# Patient Record
Sex: Female | Born: 1955
Health system: Southern US, Community
[De-identification: ages and names within clinical notes are randomized; demographics above are authoritative.]

## PROBLEM LIST (undated history)

## (undated) DIAGNOSIS — A64 Unspecified sexually transmitted disease: Secondary | ICD-10-CM

## (undated) DIAGNOSIS — E785 Hyperlipidemia, unspecified: Secondary | ICD-10-CM

## (undated) DIAGNOSIS — IMO0002 Reserved for concepts with insufficient information to code with codable children: Secondary | ICD-10-CM

## (undated) DIAGNOSIS — F17200 Nicotine dependence, unspecified, uncomplicated: Secondary | ICD-10-CM

## (undated) DIAGNOSIS — C44311 Basal cell carcinoma of skin of nose: Secondary | ICD-10-CM

## (undated) DIAGNOSIS — M1990 Unspecified osteoarthritis, unspecified site: Secondary | ICD-10-CM

## (undated) DIAGNOSIS — F329 Major depressive disorder, single episode, unspecified: Secondary | ICD-10-CM

## (undated) DIAGNOSIS — F419 Anxiety disorder, unspecified: Secondary | ICD-10-CM

## (undated) DIAGNOSIS — N951 Menopausal and female climacteric states: Secondary | ICD-10-CM

## (undated) DIAGNOSIS — I700 Atherosclerosis of aorta: Secondary | ICD-10-CM

## (undated) DIAGNOSIS — K219 Gastro-esophageal reflux disease without esophagitis: Secondary | ICD-10-CM

## (undated) DIAGNOSIS — B009 Herpesviral infection, unspecified: Secondary | ICD-10-CM

## (undated) DIAGNOSIS — IMO0001 Reserved for inherently not codable concepts without codable children: Secondary | ICD-10-CM

## (undated) DIAGNOSIS — F32A Depression, unspecified: Secondary | ICD-10-CM

## (undated) DIAGNOSIS — J449 Chronic obstructive pulmonary disease, unspecified: Secondary | ICD-10-CM

## (undated) DIAGNOSIS — G44209 Tension-type headache, unspecified, not intractable: Secondary | ICD-10-CM

## (undated) DIAGNOSIS — J189 Pneumonia, unspecified organism: Secondary | ICD-10-CM

## (undated) DIAGNOSIS — C50411 Malignant neoplasm of upper-outer quadrant of right female breast: Secondary | ICD-10-CM

## (undated) HISTORY — PX: DILATION AND CURETTAGE OF UTERUS: SHX78

## (undated) HISTORY — DX: Nicotine dependence, unspecified, uncomplicated: F17.200

## (undated) HISTORY — PX: WISDOM TOOTH EXTRACTION: SHX21

## (undated) HISTORY — DX: Menopausal and female climacteric states: N95.1

## (undated) HISTORY — DX: Malignant neoplasm of upper-outer quadrant of right female breast: C50.411

## (undated) HISTORY — PX: BASAL CELL CARCINOMA EXCISION: SHX1214

## (undated) HISTORY — DX: Reserved for inherently not codable concepts without codable children: IMO0001

## (undated) HISTORY — DX: Major depressive disorder, single episode, unspecified: F32.9

## (undated) HISTORY — DX: Unspecified sexually transmitted disease: A64

## (undated) HISTORY — DX: Depression, unspecified: F32.A

## (undated) HISTORY — DX: Chronic obstructive pulmonary disease, unspecified: J44.9

## (undated) HISTORY — DX: Unspecified osteoarthritis, unspecified site: M19.90

## (undated) HISTORY — DX: Herpesviral infection, unspecified: B00.9

## (undated) HISTORY — PX: BACK SURGERY: SHX140

## (undated) HISTORY — DX: Reserved for concepts with insufficient information to code with codable children: IMO0002

## (undated) HISTORY — DX: Atherosclerosis of aorta: I70.0

---

## 1984-07-18 HISTORY — PX: ABDOMINAL EXPLORATION SURGERY: SHX538

## 1998-09-15 ENCOUNTER — Other Ambulatory Visit: Admission: RE | Admit: 1998-09-15 | Discharge: 1998-09-15 | Payer: Self-pay | Admitting: Obstetrics and Gynecology

## 1998-10-02 ENCOUNTER — Encounter: Payer: Self-pay | Admitting: Obstetrics and Gynecology

## 1998-10-02 ENCOUNTER — Ambulatory Visit (HOSPITAL_COMMUNITY): Admission: RE | Admit: 1998-10-02 | Discharge: 1998-10-02 | Payer: Self-pay | Admitting: Obstetrics and Gynecology

## 1998-12-04 ENCOUNTER — Encounter: Payer: Self-pay | Admitting: Family Medicine

## 1998-12-04 ENCOUNTER — Ambulatory Visit (HOSPITAL_COMMUNITY): Admission: RE | Admit: 1998-12-04 | Discharge: 1998-12-04 | Payer: Self-pay | Admitting: Family Medicine

## 1999-12-21 ENCOUNTER — Other Ambulatory Visit: Admission: RE | Admit: 1999-12-21 | Discharge: 1999-12-21 | Payer: Self-pay | Admitting: Obstetrics and Gynecology

## 2002-04-24 ENCOUNTER — Other Ambulatory Visit: Admission: RE | Admit: 2002-04-24 | Discharge: 2002-04-24 | Payer: Self-pay | Admitting: Obstetrics and Gynecology

## 2002-05-23 HISTORY — PX: ENDOMETRIAL BIOPSY: SHX622

## 2002-09-03 ENCOUNTER — Encounter (INDEPENDENT_AMBULATORY_CARE_PROVIDER_SITE_OTHER): Payer: Self-pay | Admitting: Specialist

## 2002-09-03 ENCOUNTER — Ambulatory Visit (HOSPITAL_COMMUNITY): Admission: RE | Admit: 2002-09-03 | Discharge: 2002-09-03 | Payer: Self-pay | Admitting: Obstetrics and Gynecology

## 2002-09-03 ENCOUNTER — Encounter (INDEPENDENT_AMBULATORY_CARE_PROVIDER_SITE_OTHER): Payer: Self-pay

## 2002-09-03 HISTORY — PX: EXCISION OF ADNEXAL MASS: SHX5820

## 2002-09-14 DIAGNOSIS — N951 Menopausal and female climacteric states: Secondary | ICD-10-CM

## 2002-09-14 HISTORY — DX: Menopausal and female climacteric states: N95.1

## 2003-05-09 ENCOUNTER — Other Ambulatory Visit: Admission: RE | Admit: 2003-05-09 | Discharge: 2003-05-09 | Payer: Self-pay | Admitting: Obstetrics and Gynecology

## 2003-12-28 ENCOUNTER — Emergency Department (HOSPITAL_COMMUNITY): Admission: EM | Admit: 2003-12-28 | Discharge: 2003-12-28 | Payer: Self-pay | Admitting: *Deleted

## 2004-01-01 ENCOUNTER — Ambulatory Visit (HOSPITAL_COMMUNITY): Admission: RE | Admit: 2004-01-01 | Discharge: 2004-01-01 | Payer: Self-pay | Admitting: Family Medicine

## 2004-01-15 ENCOUNTER — Ambulatory Visit (HOSPITAL_COMMUNITY): Admission: RE | Admit: 2004-01-15 | Discharge: 2004-01-16 | Payer: Self-pay | Admitting: Neurological Surgery

## 2004-01-16 HISTORY — PX: ANTERIOR CERVICAL DECOMP/DISCECTOMY FUSION: SHX1161

## 2004-03-01 ENCOUNTER — Encounter: Admission: RE | Admit: 2004-03-01 | Discharge: 2004-03-01 | Payer: Self-pay | Admitting: Neurological Surgery

## 2004-04-12 ENCOUNTER — Encounter: Admission: RE | Admit: 2004-04-12 | Discharge: 2004-04-12 | Payer: Self-pay | Admitting: Neurological Surgery

## 2004-07-05 ENCOUNTER — Encounter: Admission: RE | Admit: 2004-07-05 | Discharge: 2004-07-05 | Payer: Self-pay | Admitting: Neurological Surgery

## 2004-07-09 ENCOUNTER — Other Ambulatory Visit: Admission: RE | Admit: 2004-07-09 | Discharge: 2004-07-09 | Payer: Self-pay | Admitting: Obstetrics and Gynecology

## 2005-01-21 ENCOUNTER — Ambulatory Visit (HOSPITAL_COMMUNITY): Admission: RE | Admit: 2005-01-21 | Discharge: 2005-01-21 | Payer: Self-pay | Admitting: Family Medicine

## 2005-08-25 ENCOUNTER — Other Ambulatory Visit: Admission: RE | Admit: 2005-08-25 | Discharge: 2005-08-25 | Payer: Self-pay | Admitting: Obstetrics and Gynecology

## 2006-09-29 ENCOUNTER — Other Ambulatory Visit: Admission: RE | Admit: 2006-09-29 | Discharge: 2006-09-29 | Payer: Self-pay | Admitting: Obstetrics & Gynecology

## 2007-10-02 ENCOUNTER — Other Ambulatory Visit: Admission: RE | Admit: 2007-10-02 | Discharge: 2007-10-02 | Payer: Self-pay | Admitting: Obstetrics and Gynecology

## 2008-10-03 ENCOUNTER — Encounter: Admission: RE | Admit: 2008-10-03 | Discharge: 2008-10-03 | Payer: Self-pay | Admitting: Family Medicine

## 2008-10-07 ENCOUNTER — Other Ambulatory Visit: Admission: RE | Admit: 2008-10-07 | Discharge: 2008-10-07 | Payer: Self-pay | Admitting: Obstetrics and Gynecology

## 2009-06-23 HISTORY — PX: ENDOMETRIAL BIOPSY: SHX622

## 2010-10-06 HISTORY — PX: COLONOSCOPY W/ BIOPSIES: SHX1374

## 2010-12-03 NOTE — Op Note (Signed)
NAME:  Chelsea Patterson, Chelsea Patterson NO.:  [id number]   MEDICAL RECORD NO.:  [id number]                   PATIENT TYPE:  AMB   LOCATION:  SDC                                  FACILITY:  [facility]   PHYSICIAN:  Laqueta Linden, M.D.                 DATE OF BIRTH:  [birth year]   DATE OF PROCEDURE:  09/03/2002  DATE OF DISCHARGE:                                 OPERATIVE REPORT   PREOPERATIVE DIAGNOSIS:  Right adnexal mass, rule out dermoid.   POSTOPERATIVE DIAGNOSES:  1. Right adnexal mass, fibroid versus fibroma.  2. Left ovarian endometrioma per frozen section, Dr. Debby Bud.  3. Pelvic adhesions.   PROCEDURE:  Diagnostic laparoscopy with left salpingo-oophorectomy, excision  of right ovarian mass, lysis of adhesions.   SURGEON:  Laqueta Linden, M.D.   ASSISTANT:  Edwena Felty. Romine, M.D.   ANESTHESIA:  General endotracheal anesthesia.   ESTIMATED BLOOD LOSS:  Less than 20 cc.   COUNTS:  Correct.   COMPLICATIONS:  None.   INDICATIONS FOR PROCEDURE:  The patient is a 55 year old gravida 6, para 0-1-  5-1 white female with an incidental finding of a right adnexal mass  consistent with a dermoid on ultrasound done for dysfunctional uterine  bleeding.  She has been on progesterone only pills for the past three months  with a partial response in terms of her bleeding.  She was desiring of the  most conservative surgery possible to remove the abnormality noted on  ultrasound.  CA125 was normal.  Her past history is notable for prior  cesarean section, prior ectopic pregnancy, surgery of unknown type and prior  spontaneous pregnancy losses x4..  The patient has been counseled as to the  risks, benefits, alternatives, and complications of the procedure and  desires conservative surgery even if mini-laparotomy is required and even if  it is felt that subsequent surgery will be necessary to definitively solve  her problem.  She does not desire hysterectomy at this time.  Full  consent  has been given after discussing risks, benefits, alternatives and  complications, recovery expectations.   DESCRIPTION OF PROCEDURE:  The patient was taken to the operating room and  placed on the operating table in the supine position.  After induction of  general endotracheal anesthesia, she is placed in the dorsal lithotomy  position and the abdomen, perineum and vagina were prepped and draped in  routine sterile fashion.  A transurethral Foley was placed.  A Hulka  tenaculum was placed in the anterior lip of the anterior mobile uterus.  Attention was then turned abdominally.  A 2 cm infraumbilical incision was  made.  The Veress needle was inserted in the peritoneal cavity with  intraperitoneal placement confirmed by hanging drop and saline installation  tests.  Several liters of CO2 were infused.  The Veress needle was then  removed and the trocar was then inserted without difficulty.  Inspection at  the insertion site revealed a slight amount of dripping of blood from where  the trocar entered the peritoneal cavity.  There was no obvious injury or  trauma to the omentum or surrounding bowel and the blood appeared to be  coming from surrounding where the trocar was inserted.  A 5 mm trocar was  then placed under direct vision in the right mid quadrant and a 10 mm port  was placed in the left mid quadrant after transilluminating vessels.  Inspection of the upper abdomen revealed filmy anterior adhesions in the  right mid quadrant.  The liver appeared normal.  The patient was placed in  steep Trendelenburg position. There were noted to be filmy bowel adhesions  in both adnexal regions.  The appendix appeared normal and was coiled and  was somewhat adherent just beneath the right ovary.  The right tube was  completely normal in appearance.  The right ovary was freely mobile. There  was an approximately 1 cm clear, follicular appearing cyst on one pole of  the ovary.  The other  pole of the ovary had an approximately 1.5 cm solid  appearing area felt most consistent with a fibroma.  Attention was then  turned to the left tube and ovary.  The left tube appeared normal.  The left  ovary was notable for a protuberant hemorrhagic appearing mass which also  had some yellowish fatty type tissue surrounding it and was felt to be  consistent with possibly an endometrioma or possibly the dermoid that was  felt to have been seen on ultrasound and that perhaps the ultrasound had  identified the opposite ovary if both ovaries were in the cul-de-sac.  In  any event, it was felt that the left ovarian pathology was more substantial  and required excision.  It was felt that the right ovarian mass could easily  be excised with retention of that ovary.  At this point, the tripolar  cautery was then used to cauterize and transect the left utero-ovarian  ligament and fallopian tube at the uterine cornua.  The bowel adhesions were  freed up and mobilized such that the ovary was freely mobile.  The 3-0  Vicryl Endoloops were then placed around the infundibulopelvic ligament and  the specimen was excised.  It was placed in an endosac and removed without  rupture.  Hemostasis was noted to be excellent.  This was sent to pathology  for frozen section.  Per Dr. Debby Bud, it was felt to be consistent with a  benign endometrioma.  Attention was then turned to the right ovary.  As the  patient definitely did not wish both adnexa to be removed, and due to the  location and size and characteristics of the mass, it was felt best  approached by excision of the mass only.  This was easily accomplished using  the monopolar cautery scissors and this 1.5 cm solid type mass was easily  shelled out of the ovary.  This was also placed in an endosac and removed  without incident.  The ovary where this was located was cauterized. Hemostasis was excellent.  The remainder of the ovarian tissue appeared  viable  and within normal limits.  The follicular appearing cyst was left  untouched.  Copious lavage was then accomplished.  Operative sites were  noted to be hemostatic.  All trocars were removed after visualizing the  insertional trocar from below using a 5 mm scope.  This was placed in the  left lower quadrant site with visualization around the umbilical trocar.  There were some hemorrhagic areas in the peritoneum but no evidence of  hematoma and no active bleeding.  At this point, the trocars were removed  under direct vision.  The fascia in the 10 mm port was closed with 0 Vicryl  suture.  The pneumoperitoneum was allowed to escape and the umbilical port  was then removed.  The incisions were then closed with subcuticular sutures  of 4-0 Vicryl.  Steri-Strips and pressure dressings were applied.  The  incisions were injected with a total of 10 cc of 0.25% plain Marcaine.  Pressure dressings were applied.  The Hulka tenaculum and Foley catheter  were removed.  The patient was stable and extubated on transfer to the  recovery room.  She will be observed and discharged per anesthesia protocol.  She was given routine written and verbal discharge instructions and told to  follow up in the office in two to three weeks' time or sooner if she has any  problems.  She was given a prescription for Percocet 5/325 dispense 15 one  to two q.4-6h.  p.r.n. pain with no refills and told to take Advil and Aleve and continue  her Progesterone only pills and all her routine medications postoperatively.  Of note, she received Toradol 30 mg IV and 30 mg IM prior to the conclusion  of the procedure.                                               Laqueta Linden, M.D.    LKS/MEDQ  D:  09/03/2002  T:  09/03/2002  Job:  [id number]   cc:   Holley Bouche, M.D.  [address]  [place], [place] [address]  Fax: [phone]

## 2010-12-03 NOTE — Op Note (Signed)
NAME:  Chelsea Patterson, Chelsea Patterson NO.:  [id number]   MEDICAL RECORD NO.:  [id number]                   PATIENT TYPE:  OIB   LOCATION:  3003                                 FACILITY:  [facility]   PHYSICIAN:  Tia Alert, MD                  DATE OF BIRTH:  29-Jul-1955   DATE OF PROCEDURE:  DATE OF DISCHARGE:                                 OPERATIVE REPORT   DATE OF OPERATION:  January 15, 2004.   PREOPERATIVE DIAGNOSIS:  Cervical disk herniation, C5-6, with left C6  radiculopathy.   POSTOPERATIVE DIAGNOSIS:  Cervical disk herniation, C5-6, with left C6  radiculopathy.   PROCEDURE:  1. Decompressive anterior cervical diskectomy, C5-6, for central canal and     nerve root decompression.  2. Anterior cervical arthrodesis, C5-6, utilizing a 6 mm Tutogen allograft.  3. Anterior cervical plating, C5-6, utilizing a 22.5 mm Zephyr plate.   SURGEON:  Tia Alert, MD.   ASSISTANT:  Donalee Citrin, MD.   ANESTHESIA:  General endotracheal.   COMPLICATIONS:  None apparent.   INDICATIONS FOR PROCEDURE:  Chelsea Patterson is a 55 year old white female, who  was referred to the [facility] with the complaints of neck pain  with left arm pain which failed to respond to medical management.  She had  severe left arm pain with some numbness and tingling.  She had an MRI which  showed a large cervical disk herniation at C5-6 on the left and I  recommended anterior cervical diskectomy and fusion with plating of C5-6.  She understood the risks and benefits and alternatives and wished to  proceed.   DESCRIPTION OF PROCEDURE:  The patient was taken to the operating room.  After induction of adequate general endotracheal anesthesia, she was placed  on a supine position on the operating room table where her right anterior  cervical region was prepped with Duraprep and then draped in the usual  sterile fashion.  5 mL of local anesthesia were injected and an incision was  made  through the right of midline and carried down to the platysmal muscle  which was opened and undermined with Metzenbaum scissors.  I then dissected  in a plane medial to the sternocleidomastoid muscle and internal carotid  artery and lateral to the trachea and esophagus to expose the anterior  cervical spine at C5-6.  Intraoperative fluoroscopy confirmed that level and  then the disk space was incised, and initial diskectomy was done with a  pituitary rongeur.  We then took down the longus coli muscle bilaterally and  placed the Shadowline retractors under these.  We then used curettes to  complete most of the diskectomy along with pituitary rongeurs.  We then  drilled the end-plates with the high-speed air powered Black Max drill and  drilled down to the level of the posterior longitudinal ligament.  The  operative microscope was brought onto the field and then  the posterior  longitudinal ligament was opened and removed in a circumferential fashion  with the Kerrison punch.  A large disk herniation was found at C5-6 on the  left side in the foramen and this removed with a nerve hook and pituitary  rongeurs.  We then performed a wide foraminotomy at C5-6 on the left  following the nerve root out into the foramen.  Once the decompression was  complete, we irrigated with saline solution, dried the bed with Gelfoam,  measured the interspace to be 6 mm, and then tapped a 6 mm Tutogen allograft  into the interspace at C5-6.  We then used a 22.5 mm Zephyr plate and placed  two 13 mm screws in the body of C5 and two in the body of C6, and then  locked these into position with a sliding mechanism.  We then removed the  retractor, checked our plate and graft placement with fluoroscopy.  We then  dried all bleeding points with bipolar cautery.  Once meticulous hemostasis  was achieved, we closed the deep platysma and then the subcuticular tissue  with 3-0 Vicryl, and then closed the skin with  Dermabond.  The drapes were  removed.  The patient was awakened from general anesthesia and transferred  to the recovery room in stable condition.  At the end of the procedure, all  sponge, needle, and instrument counts were correct.                                               Tia Alert, MD    DSJ/MEDQ  D:  01/15/2004  T:  01/15/2004  Job:  [id number]

## 2012-09-19 ENCOUNTER — Encounter: Payer: Self-pay | Admitting: Nurse Practitioner

## 2012-09-19 DIAGNOSIS — F339 Major depressive disorder, recurrent, unspecified: Secondary | ICD-10-CM | POA: Insufficient documentation

## 2012-10-23 ENCOUNTER — Ambulatory Visit: Payer: Self-pay | Admitting: Nurse Practitioner

## 2012-10-23 ENCOUNTER — Encounter: Payer: Self-pay | Admitting: Nurse Practitioner

## 2012-10-23 DIAGNOSIS — Z01419 Encounter for gynecological examination (general) (routine) without abnormal findings: Secondary | ICD-10-CM

## 2012-11-23 ENCOUNTER — Other Ambulatory Visit: Payer: Self-pay | Admitting: Nurse Practitioner

## 2012-11-23 NOTE — Telephone Encounter (Signed)
pts last aex was on 10/21/2011. Next aex on 11/29/2012. Refilled Estrace 1mg  and Provera 10mg - both #30/0 refills to maintain until next appt.

## 2012-11-23 NOTE — Telephone Encounter (Signed)
pts last aex was on 10/21/2011. Next aex on 11/29/2012. Refilled Estrace 1mg  and Provera 10mg - both 1 po qd #30/0 refills to maintain until next appt.

## 2012-11-29 ENCOUNTER — Ambulatory Visit: Payer: Self-pay | Admitting: Nurse Practitioner

## 2012-12-27 ENCOUNTER — Encounter: Payer: Self-pay | Admitting: Nurse Practitioner

## 2012-12-27 ENCOUNTER — Ambulatory Visit (INDEPENDENT_AMBULATORY_CARE_PROVIDER_SITE_OTHER): Payer: 59 | Admitting: Nurse Practitioner

## 2012-12-27 VITALS — BP 124/70 | HR 74 | Resp 14 | Ht 67.0 in | Wt 142.2 lb

## 2012-12-27 DIAGNOSIS — Z0000 Encounter for general adult medical examination without abnormal findings: Secondary | ICD-10-CM

## 2012-12-27 DIAGNOSIS — Z01419 Encounter for gynecological examination (general) (routine) without abnormal findings: Secondary | ICD-10-CM

## 2012-12-27 LAB — POCT URINALYSIS DIPSTICK
Leukocytes, UA: NEGATIVE
Urobilinogen, UA: NEGATIVE

## 2012-12-27 MED ORDER — MEDROXYPROGESTERONE ACETATE 10 MG PO TABS: 10.0000 mg | ORAL_TABLET | Freq: Every day | ORAL | Status: DC

## 2012-12-27 MED ORDER — ESTRADIOL 1 MG PO TABS: ORAL_TABLET | ORAL | Status: DC
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
## 2012-12-27 NOTE — Progress Notes (Signed)
If the citalopram and fatigue seem related, may be a drug side effect.  Common with citalopram.  She may need to change from the citalopram.

## 2012-12-27 NOTE — Patient Instructions (Addendum)

## 2013-01-01 ENCOUNTER — Telehealth: Payer: Self-pay | Admitting: Nurse Practitioner

## 2013-01-01 NOTE — Telephone Encounter (Signed)
I called and left message on cell phone that Dr. Hyacinth Meeker reviewed her concerns about fatigue and felt that is was most likely related to Citalopram and she needed to discuss with PCP.  I believe patient had appointment upcoming end on June or first of July. If any questions to call back.

## 2013-02-02 ENCOUNTER — Other Ambulatory Visit: Payer: Self-pay | Admitting: Nurse Practitioner

## 2013-02-04 ENCOUNTER — Telehealth: Payer: Self-pay

## 2013-02-04 NOTE — Telephone Encounter (Signed)
lmtcb needs to have MMG before can refill HRT

## 2013-02-05 NOTE — Telephone Encounter (Signed)
Patient is calling you back .? She is not sure why you call . Patient needs refills on Medicine. You can call her back [phone]  Ext. [phone] leave message. Per Patient please let her know what this is concerning.

## 2013-02-11 NOTE — Telephone Encounter (Signed)
7/28 spoke with patient re: refill of HRT request. Patient states was unaware she needed to have mammogram before she could get refills. Per note at [facility] in 2014 was told she must get this done, last one in 2012. I gave her the number to [facility] and told her we might be able to give her one month, but that would be all and she MUST get mmg done. She has been off this x one week and would really appreciate one month. Please advise if that is ok.

## 2013-02-12 MED ORDER — ESTRADIOL 1 MG PO TABS: ORAL_TABLET | ORAL | Status: DC

## 2013-02-12 MED ORDER — MEDROXYPROGESTERONE ACETATE 10 MG PO TABS: 10.0000 mg | ORAL_TABLET | Freq: Every day | ORAL | Status: DC

## 2013-02-12 NOTE — Telephone Encounter (Signed)
One month Rx sent to pharmacy. Patient is aware to schedule mmg or no more refills.

## 2013-02-12 NOTE — Telephone Encounter (Signed)
OK to give # 30 tablets only until mammo is done.  No more.

## 2013-03-17 ENCOUNTER — Other Ambulatory Visit: Payer: Self-pay | Admitting: Nurse Practitioner

## 2013-03-19 ENCOUNTER — Ambulatory Visit
Admission: RE | Admit: 2013-03-19 | Discharge: 2013-03-19 | Disposition: A | Discharge status: Home / Self Care | Payer: 59 | Source: Ambulatory Visit | Attending: Family Medicine | Admitting: Family Medicine

## 2013-03-19 ENCOUNTER — Other Ambulatory Visit: Payer: Self-pay | Admitting: Family Medicine

## 2013-03-19 DIAGNOSIS — J449 Chronic obstructive pulmonary disease, unspecified: Secondary | ICD-10-CM

## 2013-03-19 NOTE — Telephone Encounter (Signed)
Per note patient is not allowed to have refill on HRT unless MMG is scheduled. No MMG has been scheduled yet, please advise.

## 2013-03-20 ENCOUNTER — Telehealth: Payer: Self-pay | Admitting: Nurse Practitioner

## 2013-03-20 NOTE — Telephone Encounter (Signed)
Patient needs to have her Estradiol 1 mg, medroxyprogesterone 10 mg. She has been without these for 2 days now. Pharmacist said they were told that she had to make an appointment to get these refilled.

## 2013-03-20 NOTE — Telephone Encounter (Signed)
VM confirms "Chelsea Patterson" and left message that we needed MMG result before RX could we refill medication.  Will call [facility] in am (now 5:05 pm) for result and call her back.  Last refilled 02-12-13 for 30 days pending MMG result.

## 2013-03-21 NOTE — Telephone Encounter (Signed)
Estrodial and medroxyprogestrone  refill request from patient, patient has MMG  02/14/2013 [facility] [phone]

## 2013-03-22 ENCOUNTER — Other Ambulatory Visit: Payer: Self-pay

## 2013-03-22 MED ORDER — ESTRADIOL 1 MG PO TABS: ORAL_TABLET | ORAL | Status: DC

## 2013-03-22 MED ORDER — MEDROXYPROGESTERONE ACETATE 10 MG PO TABS: 10.0000 mg | ORAL_TABLET | Freq: Every day | ORAL | Status: DC

## 2013-03-22 NOTE — Telephone Encounter (Signed)
rxs sent for 1 mth with 10 refills or if insurance allows supply with 3 refills

## 2013-03-22 NOTE — Telephone Encounter (Signed)
OK for a refill on both meds

## 2013-03-22 NOTE — Telephone Encounter (Signed)
Left a voice message telling patient that we rec'd her most recent mammogram report dated 02/14/13 I have forwarded this to [facility].

## 2013-03-22 NOTE — Telephone Encounter (Signed)
Waiting for [facility] to fax recent mammo report.

## 2013-11-25 ENCOUNTER — Encounter: Payer: Self-pay | Admitting: Nurse Practitioner

## 2014-01-02 ENCOUNTER — Ambulatory Visit: Payer: 59 | Admitting: Nurse Practitioner

## 2014-01-07 ENCOUNTER — Encounter: Payer: Self-pay | Admitting: Nurse Practitioner

## 2014-01-07 ENCOUNTER — Ambulatory Visit (INDEPENDENT_AMBULATORY_CARE_PROVIDER_SITE_OTHER): Payer: 59 | Admitting: Nurse Practitioner

## 2014-01-07 VITALS — BP 100/64 | HR 60 | Ht 67.0 in | Wt 144.0 lb

## 2014-01-07 DIAGNOSIS — Z01419 Encounter for gynecological examination (general) (routine) without abnormal findings: Secondary | ICD-10-CM

## 2014-01-07 DIAGNOSIS — B373 Candidiasis of vulva and vagina: Secondary | ICD-10-CM

## 2014-01-07 DIAGNOSIS — F329 Major depressive disorder, single episode, unspecified: Secondary | ICD-10-CM

## 2014-01-07 DIAGNOSIS — B3731 Acute candidiasis of vulva and vagina: Secondary | ICD-10-CM

## 2014-01-07 DIAGNOSIS — F3289 Other specified depressive episodes: Secondary | ICD-10-CM

## 2014-01-07 DIAGNOSIS — F32A Depression, unspecified: Secondary | ICD-10-CM

## 2014-01-07 DIAGNOSIS — J449 Chronic obstructive pulmonary disease, unspecified: Secondary | ICD-10-CM

## 2014-01-07 DIAGNOSIS — Z0000 Encounter for general adult medical examination without abnormal findings: Secondary | ICD-10-CM

## 2014-01-07 LAB — POCT URINALYSIS DIPSTICK
Bilirubin, UA: NEGATIVE
Blood, UA: NEGATIVE
GLUCOSE UA: NEGATIVE
Ketones, UA: NEGATIVE
Leukocytes, UA: NEGATIVE
NITRITE UA: NEGATIVE
PH UA: 5
PROTEIN UA: NEGATIVE
UROBILINOGEN UA: NEGATIVE

## 2014-01-07 MED ORDER — MEDROXYPROGESTERONE ACETATE 10 MG PO TABS
10.0000 mg | ORAL_TABLET | Freq: Every day | ORAL | Status: DC
Start: 2014-01-07 — End: 2015-01-07

## 2014-01-07 MED ORDER — FLUCONAZOLE 150 MG PO TABS: 150.0000 mg | ORAL_TABLET | Freq: Once | ORAL | Status: DC

## 2014-01-07 MED ORDER — ESTRADIOL 1 MG PO TABS: ORAL_TABLET | ORAL | Status: DC
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
## 2014-01-07 NOTE — Patient Instructions (Signed)

## 2014-01-13 NOTE — Progress Notes (Signed)
Encounter reviewed by Dr. Brook Silva.  

## 2014-03-07 ENCOUNTER — Other Ambulatory Visit: Payer: Self-pay | Admitting: Nurse Practitioner

## 2014-03-07 NOTE — Telephone Encounter (Signed)
Last AEX: 01/07/14 Last refill: Both- 01/07/14 #90, 3 rf Current AEX:01/13/15  Pt was given a year of refills on 01/07/14  Encounter closed

## 2014-03-09 NOTE — Telephone Encounter (Signed)
Did the RX go through to the pharmacy on that day?  Maybe that's the problem with request for a refill.

## 2014-03-09 NOTE — Telephone Encounter (Signed)
I checked the orders and they went to [facility] - was that her choice or our mistake?

## 2014-03-11 NOTE — Telephone Encounter (Signed)
We sent to the right pharmacy. Called them this morning and she stated they have it Encounter closed

## 2014-05-19 ENCOUNTER — Encounter: Payer: Self-pay | Admitting: Nurse Practitioner

## 2014-06-20 ENCOUNTER — Ambulatory Visit
Admission: RE | Admit: 2014-06-20 | Discharge: 2014-06-20 | Disposition: A | Discharge status: Home / Self Care | Payer: 59 | Source: Ambulatory Visit | Attending: Family Medicine | Admitting: Family Medicine

## 2014-06-20 ENCOUNTER — Other Ambulatory Visit: Payer: Self-pay | Admitting: Family Medicine

## 2014-06-20 DIAGNOSIS — J441 Chronic obstructive pulmonary disease with (acute) exacerbation: Secondary | ICD-10-CM

## 2015-01-07 ENCOUNTER — Other Ambulatory Visit: Payer: Self-pay | Admitting: Nurse Practitioner

## 2015-01-07 NOTE — Telephone Encounter (Signed)
Medication refill request: Estradiol 1 mg ; Provera 10 mg  Last AEX:  01/07/14 with PG  Next AEX: 01/13/15 with PG  Last MMG (if hormonal medication request): 09/09/14 bi-rads 3: probably benign Refill authorized: #90 for both? , please advise.

## 2015-01-13 ENCOUNTER — Ambulatory Visit (INDEPENDENT_AMBULATORY_CARE_PROVIDER_SITE_OTHER): Payer: 59 | Admitting: Nurse Practitioner

## 2015-01-13 ENCOUNTER — Encounter: Payer: Self-pay | Admitting: Nurse Practitioner

## 2015-01-13 VITALS — BP 90/54 | HR 64 | Ht 67.0 in | Wt 143.0 lb

## 2015-01-13 DIAGNOSIS — Z0000 Encounter for general adult medical examination without abnormal findings: Secondary | ICD-10-CM | POA: Diagnosis not present

## 2015-01-13 DIAGNOSIS — Z01419 Encounter for gynecological examination (general) (routine) without abnormal findings: Secondary | ICD-10-CM | POA: Diagnosis not present

## 2015-01-13 LAB — POCT URINALYSIS DIPSTICK
Bilirubin, UA: NEGATIVE
Blood, UA: NEGATIVE
GLUCOSE UA: NEGATIVE
Ketones, UA: NEGATIVE
Leukocytes, UA: NEGATIVE
Nitrite, UA: NEGATIVE
PH UA: 5
Protein, UA: NEGATIVE
Urobilinogen, UA: NEGATIVE

## 2015-01-13 MED ORDER — MEDROXYPROGESTERONE ACETATE 5 MG PO TABS: 10.0000 mg | ORAL_TABLET | Freq: Every day | ORAL | Status: DC

## 2015-01-13 MED ORDER — ESTRADIOL 0.5 MG PO TABS: 0.5000 mg | ORAL_TABLET | Freq: Every day | ORAL | Status: DC
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
## 2015-01-13 NOTE — Patient Instructions (Signed)

## 2015-01-13 NOTE — Progress Notes (Signed)
Encounter reviewed by Dr. Ethridge Sollenberger Amundson C. Silva.  

## 2015-01-16 LAB — IPS PAP TEST WITH HPV

## 2015-08-06 ENCOUNTER — Telehealth: Payer: Self-pay | Admitting: *Deleted

## 2015-08-06 NOTE — Telephone Encounter (Signed)
Recall Notes:  6 month follow up needed to follow left breast calcifications.  Pt is on HRT.  Last MMG:  09/09/14  Pt overdue for mammogram recall.  Due 02/2015.  Follow up appointment has been made for 08/18/15.    Recall extended to 07/2015.   Routing to provider for review.  Closing encounter.

## 2015-08-19 HISTORY — PX: BREAST BIOPSY: SHX20

## 2015-09-02 ENCOUNTER — Other Ambulatory Visit: Payer: Self-pay | Admitting: Radiology

## 2015-09-04 ENCOUNTER — Telehealth: Payer: Self-pay | Admitting: Nurse Practitioner

## 2015-09-04 ENCOUNTER — Telehealth: Payer: Self-pay | Admitting: *Deleted

## 2015-09-04 ENCOUNTER — Encounter: Payer: Self-pay | Admitting: *Deleted

## 2015-09-04 DIAGNOSIS — C50411 Malignant neoplasm of upper-outer quadrant of right female breast: Secondary | ICD-10-CM

## 2015-09-04 HISTORY — DX: Malignant neoplasm of upper-outer quadrant of right female breast: C50.411

## 2015-09-04 NOTE — Telephone Encounter (Signed)
There is no report in Epic yet about Mammo at [facility] and diagnosis of breast cancer.  I have returned her call to her cell number and we are authorized to leave a message.  She was told to stop the HRT and that I would try to get back in touch with her on Monday.

## 2015-09-04 NOTE — Telephone Encounter (Signed)
Patient was recently diagnosed with Breast Cancer she wants Ms. Chelsea Patterson to be aware. Patient says that it has been recommended for her to stop taking hormones. Best # to reach: [phone]

## 2015-09-04 NOTE — Telephone Encounter (Signed)
Routing to Kem Boroughs, FNP.

## 2015-09-04 NOTE — Telephone Encounter (Signed)
Confirmed BMDC for 09/09/15 at 1215 .  Instructions and contact information given. 

## 2015-09-07 ENCOUNTER — Telehealth: Payer: Self-pay | Admitting: Nurse Practitioner

## 2015-09-07 NOTE — Telephone Encounter (Signed)
Tried again to get in touch with pt to let her know that we are aware of diagnosis of breast cancer.  She is left a message and asked to call back if we can help.  She was already told to stop HRT and that was relayed again on the message.

## 2015-09-07 NOTE — Telephone Encounter (Signed)
Please let pt know that BMD done on 08/18/15 shows the T Score at spine:  +0.00; right hip neck at -0.5; left hip neck at -0.70.  A comparison from previous exam 03/31/2011 shows  A decrease at spine and left hip.  She is still in the normal range.  While some bone loss is normal and expectant we do not want to see further loss.  She must still do exercise and walking.  Also continue with calcium and Vit D. She has had several phone calls to her the past few days as she has a new diagnosis of breast cancer.

## 2015-09-08 NOTE — Telephone Encounter (Signed)
Spoke with patient informing her of the result message from [place].  She was concerned since her Right side is causing more pain than the left.  She understands the instructions and message.  She notes that she is already following these directions.    Patient wanted to know if there is any OTC meds she can take from [facility] since she is no longer taking the hormones.  Please advise?

## 2015-09-08 NOTE — Telephone Encounter (Signed)
Routed to [place], [place]

## 2015-09-09 ENCOUNTER — Other Ambulatory Visit (HOSPITAL_BASED_OUTPATIENT_CLINIC_OR_DEPARTMENT_OTHER): Payer: 59

## 2015-09-09 ENCOUNTER — Encounter: Payer: Self-pay | Admitting: Hematology and Oncology

## 2015-09-09 ENCOUNTER — Encounter: Payer: Self-pay | Admitting: Nurse Practitioner

## 2015-09-09 ENCOUNTER — Ambulatory Visit
Admission: RE | Admit: 2015-09-09 | Discharge: 2015-09-09 | Disposition: A | Discharge status: Home / Self Care | Payer: 59 | Source: Ambulatory Visit | Attending: Radiation Oncology | Admitting: Radiation Oncology

## 2015-09-09 ENCOUNTER — Ambulatory Visit: Payer: 59 | Attending: General Surgery | Admitting: Physical Therapy

## 2015-09-09 ENCOUNTER — Encounter: Payer: Self-pay | Admitting: Physical Therapy

## 2015-09-09 ENCOUNTER — Ambulatory Visit (HOSPITAL_BASED_OUTPATIENT_CLINIC_OR_DEPARTMENT_OTHER): Payer: 59 | Admitting: Hematology and Oncology

## 2015-09-09 VITALS — BP 115/76 | HR 80 | Temp 98.1°F | Resp 18 | Wt 143.1 lb

## 2015-09-09 DIAGNOSIS — C50411 Malignant neoplasm of upper-outer quadrant of right female breast: Secondary | ICD-10-CM

## 2015-09-09 DIAGNOSIS — Z170 Estrogen receptor positive status [ER+]: Secondary | ICD-10-CM

## 2015-09-09 LAB — COMPREHENSIVE METABOLIC PANEL
ALT: 11 U/L (ref 0–55)
AST: 17 U/L (ref 5–34)
Albumin: 3.9 g/dL (ref 3.5–5.0)
Alkaline Phosphatase: 54 U/L (ref 40–150)
Anion Gap: 7 mEq/L (ref 3–11)
BUN: 10.2 mg/dL (ref 7.0–26.0)
CALCIUM: 9.4 mg/dL (ref 8.4–10.4)
CO2: 29 mEq/L (ref 22–29)
Chloride: 107 mEq/L (ref 98–109)
Creatinine: 0.9 mg/dL (ref 0.6–1.1)
EGFR: 71 mL/min/{1.73_m2} — AB (ref >90)
Glucose: 82 mg/dl (ref 70–140)
POTASSIUM: 4.1 meq/L (ref 3.5–5.1)
Sodium: 142 mEq/L (ref 136–145)
Total Bilirubin: 0.45 mg/dL (ref 0.20–1.20)
Total Protein: 6.8 g/dL (ref 6.4–8.3)

## 2015-09-09 LAB — CBC WITH DIFFERENTIAL/PLATELET
BASO%: 0.3 % (ref 0.0–2.0)
Basophils Absolute: 0 10*3/uL (ref 0.0–0.1)
EOS%: 0.5 % (ref 0.0–7.0)
Eosinophils Absolute: 0.1 10*3/uL (ref 0.0–0.5)
HEMATOCRIT: 36.7 % (ref 34.8–46.6)
HGB: 12.6 g/dL (ref 11.6–15.9)
LYMPH%: 38.3 % (ref 14.0–49.7)
MCH: 32.8 pg (ref 25.1–34.0)
MCHC: 34.3 g/dL (ref 31.5–36.0)
MCV: 95.6 fL (ref 79.5–101.0)
MONO#: 0.8 10*3/uL (ref 0.1–0.9)
MONO%: 7.9 % (ref 0.0–14.0)
NEUT%: 53 % (ref 38.4–76.8)
NEUTROS ABS: 5.3 10*3/uL (ref 1.5–6.5)
Platelets: 271 10*3/uL (ref 145–400)
RBC: 3.84 10*6/uL (ref 3.70–5.45)
RDW: 12.9 % (ref 11.2–14.5)
WBC: 10 10*3/uL (ref 3.9–10.3)
lymph#: 3.8 10*3/uL — ABNORMAL HIGH (ref 0.9–3.3)
nRBC: 0 % (ref 0–0)
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
## 2015-09-09 NOTE — Assessment & Plan Note (Signed)
Right breast biopsy invasive ductal carcinoma with papillary features, grade 1, ER/PR positive HER-2 negative Ki-67 3%; 6 month follow-up mamm: stable left breast calcifications but new right breast calcifications 2 sets 2 mm size (only one set biopsied)  Pathology radiology review:Discussed with the patient, the details of pathology including the type of breast cancer,the clinical staging, the significance of ER, PR and HER-2/neu receptors and the implications for treatment. After reviewing the pathology in detail, we proceeded to discuss the different treatment options between surgery, radiation, antiestrogen therapies.  Recommendation based on multidisciplinary tumor board: 1. Consider biopsy of the second group of calcifications left breast  2.Breast conserving surgery with sentinel lymph node biopsy 3. Followed by adjuvant radiation therapy 3. Followed by adjuvant antiestrogen therapy  Return to clinic after surgery to discuss the final pathology report.

## 2015-09-09 NOTE — Patient Instructions (Signed)

## 2015-09-09 NOTE — Progress Notes (Signed)
Ms. Chelsea Patterson is a very pleasant 60 y.o. female from [facility] with newly diagnosed invasive mammary carcinoma of the right breast.  Biopsy results revealed the tumor's prognostic profile is ER positive, PR positive, and HER2/neu negative.  She presents today with her mother to the [facility]) for treatment consideration and recommendations from the breast surgeon, radiation oncologist, and medical oncologist.     I briefly met with Ms. Chelsea Patterson and her mother during her [facility] visit today. We discussed the purpose of the Survivorship Clinic, which will include monitoring for recurrence, coordinating completion of age and gender-appropriate cancer screenings, promotion of overall wellness, as well as managing potential late/long-term side effects of anti-cancer treatments.    The treatment plan for Ms. Chelsea Patterson will likely include surgery,radiation therapy, and anti-estrogen therapy. As of today, the intent of treatment for Ms. Chelsea Patterson is cure, therefore she will be eligible for the Survivorship Clinic upon her completion of treatment.  Her survivorship care plan (SCP) document will be drafted and updated throughout the course of her treatment trajectory. She will receive the SCP in an office visit with myself in the Survivorship Clinic once she has completed treatment.   Ms. Chelsea Patterson was encouraged to ask questions and all questions were answered to her satisfaction.  She was given my business card and encouraged to contact me with any concerns regarding survivorship.  I look forward to participating in her care.   Kenn File, [place] [phone]

## 2015-09-09 NOTE — Progress Notes (Signed)
Radiation Oncology         [phone]) [phone] ________________________________  Initial Outpatient Consultation  Name: Chelsea Patterson MRN: [id number]  Date: 09/09/2015  DOB: 02/17/1956  [id number], Gwyndolyn Saxon, MD  Fanny Skates, MD   REFERRING PHYSICIAN: Fanny Skates, MD  DIAGNOSIS: Invasive ductal carcinoma in the upper-outer quadrant of the right breast, ER (95%), PR(95%), Her2-neu negative.  HISTORY OF PRESENT ILLNESS::Chelsea Patterson is a 59 y.o. female who presents because of an abnormal mammogram, revealing a 2 mm mass in the upper-outer quadrant of the right breast. She had a biopsy 09/02/2015, revealing invasive ductal carcinoma of the right breast, ER (95%), PR (95%), Her2-neu negative, and a Ki 67 of 3%.  She had a bone density performed 08/18/2015, revealing bone density in its normal limits.  PREVIOUS RADIATION THERAPY: No  PAST MEDICAL HISTORY:  has a past medical history of Menopausal state (09/14/2002); Depression; Breast cancer of upper-outer quadrant of right female breast ([place]) (09/04/2015); COPD (chronic obstructive pulmonary disease) ([place]); and Arthritis.    PAST SURGICAL HISTORY: Past Surgical History  Procedure Laterality Date  . Endometrial biopsy  05/23/2002    benign proliferative endo w/SHGM showing dermoid cyst ? on R vs. fibroid  . Excision of adnexal mass Bilateral 09/03/2002    LSO and Right OV Mass that was benign ovarian fibroma  . Endometrial biopsy  06/23/2009    proliferative type endo with breakdown with neg SHGM  . Cervical disc surgery  01/2004    C 5-6 disc repair  . Abdominal exploration surgery  1986    ectopic pregnancy  . Wisdom tooth extraction  teen  . Colonoscopy w/ biopsies  10/06/2010    Bx X 3 recheck in 5 yrs.  Dr. Collene Mares    FAMILY HISTORY: family history includes Cancer in her father and maternal grandmother; Colon cancer in her maternal uncle; Heart failure in her paternal grandfather; Hyperlipidemia in her mother; Hypertension in her  mother; Learning disabilities in her son; Osteoporosis in her mother; Thyroid disease in her maternal grandmother.  SOCIAL HISTORY:  reports that she has been smoking Cigarettes.  She has a 20 pack-year smoking history. She has never used smokeless tobacco. She reports that she does not drink alcohol or use illicit drugs.  ALLERGIES: Wellbutrin and Prozac  MEDICATIONS: Reviewed and placed in the electronic medical record   REVIEW OF SYSTEMS:  A 15 point review of systems is documented in the electronic medical record. This was obtained by the nursing staff. However, I reviewed this with the patient to discuss relevant findings and make appropriate changes.    PHYSICAL EXAM:  Vitals - 1 value per visit 6/38/7564  SYSTOLIC 332  DIASTOLIC 76  Pulse 80  Temperature 98.1  Respirations 18  Weight (lb) 143.1  Height   BMI 22.41  VISIT REPORT    General: Alert and oriented, in no acute distress Neck: Neck is supple, no palpable cervical or supraclavicular lymphadenopathy. Heart: Regular in rate and rhythm with no murmurs, rubs, or gallops. Chest: Clear to auscultation bilaterally, with no rhonchi, wheezes, or rales. Extremities: No cyanosis or edema. Lymphatics: see Neck Exam. Psychiatric: Judgment and insight are intact. Affect is appropriate. Breast: Biopsy site in the upper-outer quadrant of the right breast with mild bruising and no dominant mass. No nipple discharge or bleeding. No problems with the left breast.  Gynecologic History  Age at first menstrual period? 10  Are you still having periods? No  If you are still having periods: Are  your periods regular? No  If you no longer have periods: Have you used hormone replacement? Yes  If YES, for how long? Since age 69 When did you stop? 09/03/2015 Obstetric History:  How many children have you carried to term? 1 Your age at first live birth? 52  Pregnant now or trying to get pregnant? No  Have you used birth control pills or  hormone shots for contraception? No Health Maintenance:  Have you ever had a colonoscopy? Yes If yes, date? 2013  Have you ever had a bone density? Yes If yes, date? 08/26/2015  Date of your last PAP smear? 2016    ECOG = 0  LABORATORY DATA:  Lab Results  Component Value Date   WBC 10.0 09/09/2015   HGB 12.6 09/09/2015   HCT 36.7 09/09/2015   MCV 95.6 09/09/2015   PLT 271 09/09/2015   NEUTROABS 5.3 09/09/2015   Lab Results  Component Value Date   NA 142 09/09/2015   K 4.1 09/09/2015   CO2 29 09/09/2015   GLUCOSE 82 09/09/2015   CREATININE 0.9 09/09/2015   CALCIUM 9.4 09/09/2015      RADIOGRAPHY: [facility] images reviewed    IMPRESSION: Chelsea Patterson is a 60 yo female with invasive ductal carcinoma in the upper-outer quadrant of the right breast. She is a good candidate for a lumpectomy of the right breast and sentinel node biopsy, external radiation, and anti-estrogen therapy.  PLAN: She will get a lumpectomy of the right breast and sentinel node biopsy. Prior to this intervention she has another biopsy scheduled 09/10/2015 at 9 am to rule out a second potential lesion within the right breast.. She will follow up with me two weeks post-op.    ------------------------------------------------  Blair Promise, PhD, MD    This document serves as a record of services personally performed by Gery Pray, MD. It was created on his behalf by Lendon Collar, a trained medical scribe. The creation of this record is based on the scribe's personal observations and the provider's statements to them. This document has been checked and approved by the attending provider.

## 2015-09-11 ENCOUNTER — Telehealth: Payer: Self-pay | Admitting: Hematology and Oncology

## 2015-09-11 NOTE — Telephone Encounter (Signed)
Her calcium and Vitamin d are the most important. Regular weight bearing exercise for bone strength and yoga for flexability are recommended. No other supplements at this time.

## 2015-09-11 NOTE — Progress Notes (Signed)
[facility] Psychosocial Distress Screening Counseling Intern   Clinical Social Work was referred by distress screening protocol.  The patient scored a 5 on the Psychosocial Distress Thermometer which indicates Moderate distress. Counseling Interns Vaughan Sine and Curt Bears Beam to assess for distress and other psychosocial needs.   ONCBCN DISTRESS SCREENING 09/09/2015  Screening Type Initial Screening  Distress experienced in past week (1-10) 5  Emotional problem type Nervousness/Anxiety;Adjusting to illness  Information Concerns Type Lack of info about complementary therapy choices;Lack of info about maintaining fitness;Lack of info about treatment  Referral to support programs Yes   Counseling Intern Note: Met with Pt and mother during [facility]. Pt reported that her distress had decreased from a 5 to a 1 by the time of our encounter due to have her informational needs met during clinic and feeling a positive sense of hope regarding her Tx and Dx.  Counseling interns introduced and provided information about services and programs available at the support center.  Pt verbalized that she would reach out as needed.   FU: No specific support center follow up indicated at this time.   Vaughan Sine Counseling Intern [phone]

## 2015-09-11 NOTE — Telephone Encounter (Signed)
Called and left message informing patient the message from Vaughn.  She may call back if she has any other questions //kg

## 2015-09-11 NOTE — Telephone Encounter (Signed)
Lvm advising appt 09/08/16 and mailed calendar.

## 2015-09-14 ENCOUNTER — Telehealth: Payer: Self-pay | Admitting: *Deleted

## 2015-09-14 NOTE — Telephone Encounter (Signed)
Spoke to pt concerning [facility] from 09/09/15. Denies questions or concerns regarding dx or treatment care plan. Encourage pt to call with needs. Received verbal understanding.

## 2015-09-15 ENCOUNTER — Other Ambulatory Visit: Payer: Self-pay | Admitting: General Surgery

## 2015-09-15 DIAGNOSIS — C50411 Malignant neoplasm of upper-outer quadrant of right female breast: Secondary | ICD-10-CM

## 2015-09-16 ENCOUNTER — Telehealth: Payer: Self-pay | Admitting: Hematology and Oncology

## 2015-09-16 NOTE — Telephone Encounter (Signed)
Spoke with patient re f/u for 09/08/16. Schedule mailed per patient request.

## 2015-09-21 ENCOUNTER — Other Ambulatory Visit: Payer: Self-pay | Admitting: General Surgery

## 2015-09-21 ENCOUNTER — Telehealth: Payer: Self-pay | Admitting: Hematology and Oncology

## 2015-09-21 ENCOUNTER — Other Ambulatory Visit: Payer: Self-pay | Admitting: *Deleted

## 2015-09-21 DIAGNOSIS — C50911 Malignant neoplasm of unspecified site of right female breast: Secondary | ICD-10-CM

## 2015-09-21 NOTE — Telephone Encounter (Signed)
lvm for pt regarding to April appt....pt ok and aware

## 2015-09-25 ENCOUNTER — Ambulatory Visit
Admission: RE | Admit: 2015-09-25 | Discharge: 2015-09-25 | Disposition: A | Discharge status: Home / Self Care | Payer: 59 | Source: Ambulatory Visit | Attending: General Surgery | Admitting: General Surgery

## 2015-09-25 DIAGNOSIS — C50911 Malignant neoplasm of unspecified site of right female breast: Secondary | ICD-10-CM

## 2015-09-25 MED ORDER — GADOBENATE DIMEGLUMINE 529 MG/ML IV SOLN
13.0000 mL | Freq: Once | INTRAVENOUS | Status: AC | PRN
  Administered 2015-09-25: 13 mL via INTRAVENOUS

## 2015-10-06 ENCOUNTER — Encounter (HOSPITAL_BASED_OUTPATIENT_CLINIC_OR_DEPARTMENT_OTHER): Payer: Self-pay | Admitting: *Deleted

## 2015-10-08 ENCOUNTER — Encounter (HOSPITAL_BASED_OUTPATIENT_CLINIC_OR_DEPARTMENT_OTHER)
Admission: RE | Admit: 2015-10-08 | Discharge: 2015-10-08 | Disposition: A | Discharge status: Home / Self Care | Payer: 59 | Source: Ambulatory Visit | Attending: General Surgery | Admitting: General Surgery

## 2015-10-08 ENCOUNTER — Other Ambulatory Visit: Payer: Self-pay | Admitting: General Surgery

## 2015-10-08 DIAGNOSIS — Z01812 Encounter for preprocedural laboratory examination: Secondary | ICD-10-CM | POA: Insufficient documentation

## 2015-10-08 DIAGNOSIS — C50411 Malignant neoplasm of upper-outer quadrant of right female breast: Secondary | ICD-10-CM | POA: Diagnosis not present

## 2015-10-08 LAB — COMPREHENSIVE METABOLIC PANEL
ALBUMIN: 4 g/dL (ref 3.5–5.0)
ALK PHOS: 59 U/L (ref 38–126)
ALT: 38 U/L (ref 14–54)
AST: 34 U/L (ref 15–41)
Anion gap: 7 (ref 5–15)
BUN: 11 mg/dL (ref 6–20)
CALCIUM: 9.6 mg/dL (ref 8.9–10.3)
CHLORIDE: 106 mmol/L (ref 101–111)
CO2: 28 mmol/L (ref 22–32)
CREATININE: 0.87 mg/dL (ref 0.44–1.00)
GFR calc Af Amer: >60 mL/min (ref >60)
GFR calc non Af Amer: >60 mL/min (ref >60)
GLUCOSE: 89 mg/dL (ref 65–99)
Potassium: 4.8 mmol/L (ref 3.5–5.1)
SODIUM: 141 mmol/L (ref 135–145)
Total Bilirubin: 0.5 mg/dL (ref 0.3–1.2)
Total Protein: 6.7 g/dL (ref 6.5–8.1)

## 2015-10-08 LAB — CBC WITH DIFFERENTIAL/PLATELET
BASOS ABS: 0 10*3/uL (ref 0.0–0.1)
BASOS PCT: 0 %
EOS PCT: 1 %
Eosinophils Absolute: 0.1 10*3/uL (ref 0.0–0.7)
HCT: 37.2 % (ref 36.0–46.0)
HEMOGLOBIN: 12.4 g/dL (ref 12.0–15.0)
LYMPHS PCT: 35 %
Lymphs Abs: 3.2 10*3/uL (ref 0.7–4.0)
MCH: 32.1 pg (ref 26.0–34.0)
MCHC: 33.3 g/dL (ref 30.0–36.0)
MCV: 96.4 fL (ref 78.0–100.0)
MONO ABS: 0.6 10*3/uL (ref 0.1–1.0)
MONOS PCT: 7 %
NEUTROS ABS: 5.2 10*3/uL (ref 1.7–7.7)
Neutrophils Relative %: 57 %
PLATELETS: 294 10*3/uL (ref 150–400)
RBC: 3.86 MIL/uL — AB (ref 3.87–5.11)
RDW: 12.8 % (ref 11.5–15.5)
WBC: 9 10*3/uL (ref 4.0–10.5)

## 2015-10-13 ENCOUNTER — Encounter (HOSPITAL_COMMUNITY): Admission: RE | Admit: 2015-10-13 | Payer: 59 | Source: Ambulatory Visit

## 2015-10-13 ENCOUNTER — Ambulatory Visit (HOSPITAL_BASED_OUTPATIENT_CLINIC_OR_DEPARTMENT_OTHER): Admission: RE | Admit: 2015-10-13 | Payer: 59 | Source: Ambulatory Visit | Admitting: General Surgery

## 2015-10-13 HISTORY — DX: Gastro-esophageal reflux disease without esophagitis: K21.9

## 2015-10-13 HISTORY — DX: Anxiety disorder, unspecified: F41.9

## 2015-10-13 SURGERY — RADIOACTIVE SEED GUIDED PARTIAL MASTECTOMY WITH AXILLARY SENTINEL LYMPH NODE BIOPSY
Anesthesia: General | Site: Breast | Laterality: Right

## 2015-10-15 ENCOUNTER — Encounter: Payer: Self-pay | Admitting: *Deleted

## 2015-10-15 ENCOUNTER — Telehealth: Payer: Self-pay | Admitting: Hematology and Oncology

## 2015-10-15 NOTE — Progress Notes (Signed)
Called pt to inform we are going to r/s her appt with Dr. Lindi Adie to 4/18 considering her sx date and type has changed. Informed we will cancel her appt with dr. Sondra Come as well. Received verbal understanding.

## 2015-10-15 NOTE — Telephone Encounter (Signed)
R/s appt per 3/30 pof. Pt aware of new date/time

## 2015-10-20 ENCOUNTER — Ambulatory Visit: Payer: 59 | Admitting: Hematology and Oncology

## 2015-10-25 NOTE — H&P (Signed)
Chelsea Patterson  Location: [facility] Patient #: [id number] DOB: Nov 08, 1955 Single / Language: Chelsea Patterson / Race: White Female        History of Present Illness The patient is a 60 year old female who presents with breast cancer. This is a 60 year old white female with invasive cancer of the right breast upper outer quadrant and 2 other suspicious areas on imaging. She has now decided that she wants bilateral mastectomies. We have discussed this and I support her decision.  She was initially evaluated in the [facility] on October 22 by Dr. Lindi Adie, Dr. Sondra Come, and me. Her PCP is Dr. Shirline Frees. Her gynecologist is Dr. Joan Flores. .  No prior breast problems. Recent screening imaging studies showed calcifications in the central right breast upper outer quadrant somewhat posterior to the nipple. Small 2 mm area was biopsy showed invasive adenocarcinoma, receptor positive, HER-2 negative. A second density in the medial right breast, and a separate quadrant thought to represent benign calcifications but close follow-up was advised so we went ahead and biopsied that and it was somewhat indeterminate showing atrophic glands, cannot rule out malignancy reviewed by multiple pathologist including Dr. Lyndon Code. Subsequent MRI showed a third area of enhancement in the right breast laterally, somewhat more inferiorly. She declined a third biopsy and wanted to go ahead with mastectomy. Initially she was willing to have double lumpectomy and sentinel node but she has completely changed her opinion about that and I can understand.  We talked for a while about immediate and delayed reconstruction. She stated that she clearly does not want any reconstruction at this point in time.  Family history reveals father died of Merkel cell cancer. Uncle died of colon cancer. A second uncle had bone cancer. A cousin had stomach cancer. A grandmother had thyroid cancer. There is no breast or ovarian  cancer.  Comorbidities include depression and anxiety on medications, COPD, ongoing tobacco abuse which I have urged her to stop, hyperlipidemia. Surgery for ectopic pregnancy. Surgery for cervical disc disease. Ovarian cystectomy for a chocolate cyst. She has stopped her hormone replacement therapy.  She's going to be scheduled for a right total mastectomy with right axillary sentinel node biopsy, left prophylactic total mastectomy, without reconstruction. I discussed the indications, details, techniques, and numerous risk of the surgery with her and her family members today patient as were the risks of bleeding, infection, skin necrosis, seroma formation, arm swelling, arm numbness, need shoulder disability, need for physical therapy, possibility of extensive axillary surgery if she has multiple positive nodes, need for radiation therapy if she has positive nodes, and other unforeseen problems. Decisions regarding chemotherapy will have to wait until we get the final pathology and staging. She understands all these issues. All of her questions were answered. She agrees with this plan.  She knows that we may have to change the date of surgery. She will be out of work for 8 weeks.   Allergies  PROzac *ANTIDEPRESSANTS* Wellbutrin *ANTIDEPRESSANTS*  Medication History  Atorvastatin Calcium ('40MG'$  Tablet, Oral) Active. Escitalopram Oxalate ('20MG'$  Tablet, Oral) Active. Estradiol (0.'5MG'$  Tablet, Oral) Active. Spiriva HandiHaler (18MCG Capsule, Inhalation) Active. ValACYclovir HCl ('500MG'$  Tablet, Oral as needed) Active. Medications Reconciled  Vitals  Weight: 140 lb Height: 66.5in Body Surface Area: 1.73 m Body Mass Index: 22.26 kg/m  Temp.: 98.89F  Pulse: 87 (Regular)  BP: 128/76 (Sitting, Left Arm, Standard)    Physical Exam  General Mental Status-Alert. General Appearance-Not in acute distress. Build & Nutrition-Well nourished. Posture-Normal  posture.  Gait-Normal.  Head and Neck Head-normocephalic, atraumatic with no lesions or palpable masses. Trachea-midline. Thyroid Gland Characteristics - normal size and consistency and no palpable nodules.  Chest and Lung Exam Chest and lung exam reveals -on auscultation, normal breath sounds, no adventitious sounds and normal vocal resonance.  Breast Note: Medium sized. A 34C by history. A little bit lumpy but no dominant mass in either breast. No skin changes. No axillary adenopathy. No supraclavicular adenopathy.   Cardiovascular Cardiovascular examination reveals -normal heart sounds, regular rate and rhythm with no murmurs and femoral artery auscultation bilaterally reveals normal pulses, no bruits, no thrills.  Abdomen Inspection Inspection of the abdomen reveals - No Hernias. Palpation/Percussion Palpation and Percussion of the abdomen reveal - Soft, Non Tender, No Rigidity (guarding), No hepatosplenomegaly and No Palpable abdominal masses.  Neurologic Neurologic evaluation reveals -alert and oriented x 3 with no impairment of recent or remote memory, normal attention span and ability to concentrate, normal sensation and normal coordination.  Neuropsychiatric Note: Some anxiety but very good insight. Seems very pleasant and very competent and understands the implications of her decisions.   Musculoskeletal Normal Exam - Bilateral-Upper Extremity Strength Normal and Lower Extremity Strength Normal.    Assessment & Plan BREAST CANCER OF UPPER-OUTER QUADRANT OF RIGHT FEMALE BREAST (C50.411)  We have discussed your right breast cancer again today. You have an invasive cancer in the right breast upper outer quadrant which is hormone receptor positive. You have had a second biopsy in the medial right breast which is suspicious but not diagnostic of malignancy. Your MRI shows a third area of enhancement in the right breast laterally. You have chosen not to  have the third area biopsied, and you have requested that we proceed with bilateral mastectomies. Considering your imaging and biopsy findings on the right as well as her extensive family history of cancers, this is reasonable.  We will change your surgery. you will now be scheduled for a right total mastectomy, right axillary sentinel node biopsy, left prophylactic mastectomy We have discussed your feelings about immediate or delayed reconstruction, and you have stated you do not want any reconstruction at this time. We have discussed the indications, techniques, and numerous risk of the surgery you will need to spend one or 2 nights in the hospital you will have drains temporarily you will be referred to physical therapy once we remove the drains 2 or 3 weeks later you will be referred back to Dr. Lindi Adie once we know the final pathology  ABNORMAL MAMMOGRAM OF RIGHT BREAST (R92.8) COPD, MILD (J44.9) TOBACCO ABUSE (Z72.0) ANXIETY AND DEPRESSION (F41.9)   Edsel Petrin. [name], M.D., [facility], [facility] General and Minimally invasive Surgery Breast and Colorectal Surgery Office:   [phone] Pager:   [phone]

## 2015-10-26 ENCOUNTER — Encounter (HOSPITAL_COMMUNITY): Payer: Self-pay | Admitting: *Deleted

## 2015-10-26 MED ORDER — SCOPOLAMINE 1 MG/3DAYS TD PT72: 1.0000 | MEDICATED_PATCH | Freq: Once | TRANSDERMAL | Status: DC | PRN

## 2015-10-26 MED ORDER — CHLORHEXIDINE GLUCONATE 4 % EX LIQD: 1.0000 "application " | Freq: Once | CUTANEOUS | Status: DC

## 2015-10-26 MED ORDER — GLYCOPYRROLATE 0.2 MG/ML IJ SOLN: 0.2000 mg | Freq: Once | INTRAMUSCULAR | Status: DC | PRN

## 2015-10-26 MED ORDER — MIDAZOLAM HCL 2 MG/2ML IJ SOLN: 1.0000 mg | INTRAMUSCULAR | Status: DC | PRN

## 2015-10-26 MED ORDER — DEXTROSE 5 % IV SOLN: 2.0000 g | INTRAVENOUS | Status: DC

## 2015-10-26 MED ORDER — CEFAZOLIN SODIUM-DEXTROSE 2-4 GM/100ML-% IV SOLN
2.0000 g | INTRAVENOUS | Status: AC
  Administered 2015-10-27: 2 g via INTRAVENOUS
  Filled 2015-10-26 (×2): qty 100

## 2015-10-26 MED ORDER — FENTANYL CITRATE (PF) 100 MCG/2ML IJ SOLN: 50.0000 ug | INTRAMUSCULAR | Status: DC | PRN

## 2015-10-26 MED ORDER — LACTATED RINGERS IV SOLN: INTRAVENOUS | Status: DC

## 2015-10-27 ENCOUNTER — Ambulatory Visit (HOSPITAL_COMMUNITY)
Admission: RE | Admit: 2015-10-27 | Discharge: 2015-10-29 | Disposition: A | Discharge status: Home / Self Care | Payer: 59 | Source: Ambulatory Visit | Attending: General Surgery | Admitting: General Surgery

## 2015-10-27 ENCOUNTER — Ambulatory Visit (HOSPITAL_COMMUNITY): Payer: 59 | Admitting: Anesthesiology

## 2015-10-27 ENCOUNTER — Encounter (HOSPITAL_COMMUNITY): Payer: Self-pay | Admitting: Surgery

## 2015-10-27 ENCOUNTER — Encounter (HOSPITAL_COMMUNITY)
Admission: RE | Disposition: A | Discharge status: Home / Self Care | Payer: Self-pay | Source: Ambulatory Visit | Attending: General Surgery

## 2015-10-27 ENCOUNTER — Encounter (HOSPITAL_COMMUNITY)
Admission: RE | Admit: 2015-10-27 | Discharge: 2015-10-27 | Disposition: A | Discharge status: Home / Self Care | Payer: 59 | Source: Ambulatory Visit | Attending: General Surgery | Admitting: General Surgery

## 2015-10-27 DIAGNOSIS — J449 Chronic obstructive pulmonary disease, unspecified: Secondary | ICD-10-CM | POA: Diagnosis not present

## 2015-10-27 DIAGNOSIS — E785 Hyperlipidemia, unspecified: Secondary | ICD-10-CM | POA: Insufficient documentation

## 2015-10-27 DIAGNOSIS — F329 Major depressive disorder, single episode, unspecified: Secondary | ICD-10-CM | POA: Diagnosis not present

## 2015-10-27 DIAGNOSIS — C50411 Malignant neoplasm of upper-outer quadrant of right female breast: Secondary | ICD-10-CM

## 2015-10-27 DIAGNOSIS — F419 Anxiety disorder, unspecified: Secondary | ICD-10-CM | POA: Insufficient documentation

## 2015-10-27 DIAGNOSIS — F17210 Nicotine dependence, cigarettes, uncomplicated: Secondary | ICD-10-CM | POA: Insufficient documentation

## 2015-10-27 DIAGNOSIS — C50911 Malignant neoplasm of unspecified site of right female breast: Secondary | ICD-10-CM | POA: Diagnosis present

## 2015-10-27 DIAGNOSIS — Z23 Encounter for immunization: Secondary | ICD-10-CM | POA: Insufficient documentation

## 2015-10-27 HISTORY — PX: MASTECTOMY W/ SENTINEL NODE BIOPSY: SHX2001

## 2015-10-27 HISTORY — DX: Basal cell carcinoma of skin of nose: C44.311

## 2015-10-27 HISTORY — DX: Pneumonia, unspecified organism: J18.9

## 2015-10-27 HISTORY — PX: MASTECTOMY COMPLETE / SIMPLE W/ SENTINEL NODE BIOPSY: SUR846

## 2015-10-27 HISTORY — DX: Tension-type headache, unspecified, not intractable: G44.209

## 2015-10-27 HISTORY — PX: MASTECTOMY COMPLETE / SIMPLE: SUR845

## 2015-10-27 HISTORY — DX: Hyperlipidemia, unspecified: E78.5

## 2015-10-27 LAB — CBC
HEMATOCRIT: 33.7 % — AB (ref 36.0–46.0)
HEMOGLOBIN: 11.2 g/dL — AB (ref 12.0–15.0)
MCH: 32 pg (ref 26.0–34.0)
MCHC: 33.2 g/dL (ref 30.0–36.0)
MCV: 96.3 fL (ref 78.0–100.0)
Platelets: 249 10*3/uL (ref 150–400)
RBC: 3.5 MIL/uL — ABNORMAL LOW (ref 3.87–5.11)
RDW: 13 % (ref 11.5–15.5)
WBC: 14.2 10*3/uL — ABNORMAL HIGH (ref 4.0–10.5)

## 2015-10-27 LAB — CREATININE, SERUM
Creatinine, Ser: 0.78 mg/dL (ref 0.44–1.00)
GFR calc Af Amer: >60 mL/min (ref >60)

## 2015-10-27 LAB — CBC WITH DIFFERENTIAL/PLATELET
BASOS PCT: 0 %
Basophils Absolute: 0 10*3/uL (ref 0.0–0.1)
EOS ABS: 0.1 10*3/uL (ref 0.0–0.7)
Eosinophils Relative: 1 %
HEMATOCRIT: 37.9 % (ref 36.0–46.0)
HEMOGLOBIN: 13.2 g/dL (ref 12.0–15.0)
LYMPHS ABS: 3.1 10*3/uL (ref 0.7–4.0)
Lymphocytes Relative: 38 %
MCH: 33.2 pg (ref 26.0–34.0)
MCHC: 34.8 g/dL (ref 30.0–36.0)
MCV: 95.2 fL (ref 78.0–100.0)
MONO ABS: 0.6 10*3/uL (ref 0.1–1.0)
MONOS PCT: 8 %
NEUTROS PCT: 53 %
Neutro Abs: 4.2 10*3/uL (ref 1.7–7.7)
Platelets: 299 10*3/uL (ref 150–400)
RBC: 3.98 MIL/uL (ref 3.87–5.11)
RDW: 13 % (ref 11.5–15.5)
WBC: 8 10*3/uL (ref 4.0–10.5)

## 2015-10-27 LAB — COMPREHENSIVE METABOLIC PANEL
ALBUMIN: 3.9 g/dL (ref 3.5–5.0)
ALK PHOS: 54 U/L (ref 38–126)
ALT: 16 U/L (ref 14–54)
ANION GAP: 11 (ref 5–15)
AST: 23 U/L (ref 15–41)
BILIRUBIN TOTAL: 0.5 mg/dL (ref 0.3–1.2)
BUN: 11 mg/dL (ref 6–20)
CALCIUM: 9.4 mg/dL (ref 8.9–10.3)
CO2: 24 mmol/L (ref 22–32)
Chloride: 109 mmol/L (ref 101–111)
Creatinine, Ser: 0.84 mg/dL (ref 0.44–1.00)
GFR calc non Af Amer: >60 mL/min (ref >60)
GLUCOSE: 96 mg/dL (ref 65–99)
POTASSIUM: 4.6 mmol/L (ref 3.5–5.1)
SODIUM: 144 mmol/L (ref 135–145)
TOTAL PROTEIN: 6.5 g/dL (ref 6.5–8.1)

## 2015-10-27 SURGERY — MASTECTOMY WITH SENTINEL LYMPH NODE BIOPSY
Anesthesia: Regional | Site: Breast | Laterality: Bilateral

## 2015-10-27 MED ORDER — PHENYLEPHRINE 40 MCG/ML (10ML) SYRINGE FOR IV PUSH (FOR BLOOD PRESSURE SUPPORT)
PREFILLED_SYRINGE | INTRAVENOUS | Status: AC
  Filled 2015-10-27: qty 10

## 2015-10-27 MED ORDER — OXYCODONE HCL 5 MG PO TABS: 5.0000 mg | ORAL_TABLET | ORAL | Status: DC | PRN

## 2015-10-27 MED ORDER — VALACYCLOVIR HCL 500 MG PO TABS
500.0000 mg | ORAL_TABLET | Freq: Every day | ORAL | Status: DC
  Administered 2015-10-28: 500 mg via ORAL
  Filled 2015-10-27 (×3): qty 1

## 2015-10-27 MED ORDER — METHYLENE BLUE 0.5 % INJ SOLN
INTRAVENOUS | Status: AC
  Filled 2015-10-27: qty 10

## 2015-10-27 MED ORDER — COLLAGEN-VITAMIN C 740-125 MG PO CAPS: ORAL_CAPSULE | Freq: Every day | ORAL | Status: DC

## 2015-10-27 MED ORDER — LACTATED RINGERS IV SOLN
INTRAVENOUS | Status: DC | PRN
  Administered 2015-10-27 (×2): via INTRAVENOUS

## 2015-10-27 MED ORDER — HYDROMORPHONE HCL 1 MG/ML IJ SOLN
INTRAMUSCULAR | Status: AC
  Administered 2015-10-27: 1 mg
  Filled 2015-10-27: qty 1

## 2015-10-27 MED ORDER — OXYCODONE HCL 5 MG PO TABS
ORAL_TABLET | ORAL | Status: AC
  Administered 2015-10-27: 5 mg via ORAL
  Filled 2015-10-27: qty 1

## 2015-10-27 MED ORDER — OXYCODONE HCL 5 MG PO TABS
5.0000 mg | ORAL_TABLET | Freq: Once | ORAL | Status: AC | PRN
  Administered 2015-10-27: 5 mg via ORAL

## 2015-10-27 MED ORDER — TIOTROPIUM BROMIDE MONOHYDRATE 18 MCG IN CAPS
18.0000 ug | ORAL_CAPSULE | Freq: Every day | RESPIRATORY_TRACT | Status: DC
  Administered 2015-10-28: 18 ug via RESPIRATORY_TRACT
  Filled 2015-10-27: qty 5

## 2015-10-27 MED ORDER — FENTANYL CITRATE (PF) 100 MCG/2ML IJ SOLN
INTRAMUSCULAR | Status: DC | PRN
  Administered 2015-10-27: 50 ug via INTRAVENOUS
  Administered 2015-10-27 (×4): 100 ug via INTRAVENOUS
  Administered 2015-10-27 (×4): 50 ug via INTRAVENOUS

## 2015-10-27 MED ORDER — BUPIVACAINE-EPINEPHRINE (PF) 0.5% -1:200000 IJ SOLN
INTRAMUSCULAR | Status: DC | PRN
  Administered 2015-10-27 (×2): 20 mL via PERINEURAL

## 2015-10-27 MED ORDER — RISAQUAD PO CAPS
1.0000 | ORAL_CAPSULE | Freq: Every day | ORAL | Status: DC
  Administered 2015-10-28: 1 via ORAL
  Filled 2015-10-27 (×3): qty 1

## 2015-10-27 MED ORDER — SODIUM CHLORIDE 0.9 % IJ SOLN
INTRAVENOUS | Status: DC | PRN
  Administered 2015-10-27: 08:00:00 via INTRAMUSCULAR

## 2015-10-27 MED ORDER — PROPOFOL 10 MG/ML IV BOLUS
INTRAVENOUS | Status: AC
  Filled 2015-10-27: qty 20

## 2015-10-27 MED ORDER — ARTIFICIAL TEARS OP OINT
TOPICAL_OINTMENT | OPHTHALMIC | Status: AC
  Filled 2015-10-27: qty 3.5

## 2015-10-27 MED ORDER — ONDANSETRON HCL 4 MG/2ML IJ SOLN
INTRAMUSCULAR | Status: AC
  Filled 2015-10-27: qty 2

## 2015-10-27 MED ORDER — LIDOCAINE HCL (CARDIAC) 20 MG/ML IV SOLN
INTRAVENOUS | Status: AC
  Filled 2015-10-27: qty 5

## 2015-10-27 MED ORDER — SODIUM CHLORIDE 0.9 % IJ SOLN
INTRAMUSCULAR | Status: AC
Start: 2015-10-27 — End: 2015-10-27
  Filled 2015-10-27: qty 10

## 2015-10-27 MED ORDER — PROPOFOL 10 MG/ML IV BOLUS
INTRAVENOUS | Status: DC | PRN
  Administered 2015-10-27: 100 mg via INTRAVENOUS
  Administered 2015-10-27: 50 mg via INTRAVENOUS

## 2015-10-27 MED ORDER — PNEUMOCOCCAL VAC POLYVALENT 25 MCG/0.5ML IJ INJ
0.5000 mL | INJECTION | INTRAMUSCULAR | Status: AC
  Administered 2015-10-28: 0.5 mL via INTRAMUSCULAR
  Filled 2015-10-27: qty 0.5

## 2015-10-27 MED ORDER — ADULT MULTIVITAMIN W/MINERALS CH
1.0000 | ORAL_TABLET | Freq: Every day | ORAL | Status: DC
  Administered 2015-10-28: 1 via ORAL
  Filled 2015-10-27 (×2): qty 1

## 2015-10-27 MED ORDER — ROCURONIUM BROMIDE 50 MG/5ML IV SOLN
INTRAVENOUS | Status: AC
  Filled 2015-10-27: qty 1

## 2015-10-27 MED ORDER — FENTANYL CITRATE (PF) 250 MCG/5ML IJ SOLN
INTRAMUSCULAR | Status: AC
  Filled 2015-10-27: qty 5

## 2015-10-27 MED ORDER — PHENYLEPHRINE HCL 10 MG/ML IJ SOLN
10.0000 mg | INTRAVENOUS | Status: DC | PRN
  Administered 2015-10-27: 40 ug/min via INTRAVENOUS

## 2015-10-27 MED ORDER — SENNOSIDES-DOCUSATE SODIUM 8.6-50 MG PO TABS: 1.0000 | ORAL_TABLET | Freq: Every evening | ORAL | Status: DC | PRN

## 2015-10-27 MED ORDER — ONDANSETRON HCL 4 MG/2ML IJ SOLN
4.0000 mg | Freq: Four times a day (QID) | INTRAMUSCULAR | Status: DC | PRN
  Administered 2015-10-27: 4 mg via INTRAVENOUS
  Filled 2015-10-27: qty 2

## 2015-10-27 MED ORDER — PANTOPRAZOLE SODIUM 40 MG PO TBEC
40.0000 mg | DELAYED_RELEASE_TABLET | Freq: Every day | ORAL | Status: DC
  Administered 2015-10-28: 40 mg via ORAL
  Filled 2015-10-27: qty 1

## 2015-10-27 MED ORDER — CEFAZOLIN SODIUM-DEXTROSE 2-4 GM/100ML-% IV SOLN
2.0000 g | Freq: Once | INTRAVENOUS | Status: AC
  Administered 2015-10-27: 2 g via INTRAVENOUS
  Filled 2015-10-27: qty 100

## 2015-10-27 MED ORDER — CHLORHEXIDINE GLUCONATE 4 % EX LIQD: 1.0000 "application " | Freq: Once | CUTANEOUS | Status: DC

## 2015-10-27 MED ORDER — VITAMIN D 1000 UNITS PO TABS
4000.0000 [IU] | ORAL_TABLET | Freq: Every day | ORAL | Status: DC
  Administered 2015-10-28: 4000 [IU] via ORAL
  Filled 2015-10-27: qty 4

## 2015-10-27 MED ORDER — ENOXAPARIN SODIUM 40 MG/0.4ML ~~LOC~~ SOLN
40.0000 mg | SUBCUTANEOUS | Status: DC
  Administered 2015-10-28 – 2015-10-29 (×2): 40 mg via SUBCUTANEOUS
  Filled 2015-10-27 (×2): qty 0.4

## 2015-10-27 MED ORDER — POTASSIUM CHLORIDE IN NACL 20-0.9 MEQ/L-% IV SOLN
INTRAVENOUS | Status: DC
  Administered 2015-10-27 – 2015-10-28 (×4): via INTRAVENOUS
  Filled 2015-10-27 (×4): qty 1000

## 2015-10-27 MED ORDER — ONDANSETRON HCL 4 MG/2ML IJ SOLN
4.0000 mg | Freq: Four times a day (QID) | INTRAMUSCULAR | Status: AC | PRN
  Administered 2015-10-27: 4 mg via INTRAVENOUS

## 2015-10-27 MED ORDER — B COMPLEX-C PO TABS
1.0000 | ORAL_TABLET | Freq: Every day | ORAL | Status: DC
  Administered 2015-10-28: 1 via ORAL
  Filled 2015-10-27 (×3): qty 1

## 2015-10-27 MED ORDER — 0.9 % SODIUM CHLORIDE (POUR BTL) OPTIME
TOPICAL | Status: DC | PRN
  Administered 2015-10-27 (×3): 1000 mL

## 2015-10-27 MED ORDER — LIDOCAINE HCL (CARDIAC) 20 MG/ML IV SOLN
INTRAVENOUS | Status: DC | PRN
  Administered 2015-10-27: 80 mg via INTRAVENOUS

## 2015-10-27 MED ORDER — ATORVASTATIN CALCIUM 40 MG PO TABS
40.0000 mg | ORAL_TABLET | Freq: Every day | ORAL | Status: DC
  Administered 2015-10-28: 40 mg via ORAL
  Filled 2015-10-27: qty 1

## 2015-10-27 MED ORDER — POLYETHYL GLYCOL-PROPYL GLYCOL 0.4-0.3 % OP GEL: OPHTHALMIC | Status: DC | PRN

## 2015-10-27 MED ORDER — OXYCODONE HCL 5 MG/5ML PO SOLN: 5.0000 mg | Freq: Once | ORAL | Status: AC | PRN

## 2015-10-27 MED ORDER — MIDAZOLAM HCL 2 MG/2ML IJ SOLN
INTRAMUSCULAR | Status: AC
  Filled 2015-10-27: qty 2

## 2015-10-27 MED ORDER — HYDROMORPHONE HCL 1 MG/ML IJ SOLN: 1.0000 mg | INTRAMUSCULAR | Status: DC | PRN

## 2015-10-27 MED ORDER — ONDANSETRON 4 MG PO TBDP: 4.0000 mg | ORAL_TABLET | Freq: Four times a day (QID) | ORAL | Status: DC | PRN

## 2015-10-27 MED ORDER — SODIUM CHLORIDE 0.9 % IJ SOLN
INTRAMUSCULAR | Status: AC
  Filled 2015-10-27: qty 10

## 2015-10-27 MED ORDER — TECHNETIUM TC 99M SULFUR COLLOID FILTERED
1.0000 | Freq: Once | INTRAVENOUS | Status: AC | PRN
  Administered 2015-10-27: 1 via INTRADERMAL

## 2015-10-27 MED ORDER — MIDAZOLAM HCL 5 MG/5ML IJ SOLN
INTRAMUSCULAR | Status: DC | PRN
Start: 2015-10-27 — End: 2015-10-27
  Administered 2015-10-27: 2 mg via INTRAVENOUS

## 2015-10-27 MED ORDER — HYDROMORPHONE HCL 1 MG/ML IJ SOLN
0.2500 mg | INTRAMUSCULAR | Status: DC | PRN
  Administered 2015-10-27 (×2): 0.25 mg via INTRAVENOUS

## 2015-10-27 MED ORDER — SUCCINYLCHOLINE CHLORIDE 20 MG/ML IJ SOLN
INTRAMUSCULAR | Status: AC
  Filled 2015-10-27: qty 1

## 2015-10-27 MED ORDER — HYDROMORPHONE HCL 1 MG/ML IJ SOLN
INTRAMUSCULAR | Status: AC
  Filled 2015-10-27: qty 1

## 2015-10-27 MED ORDER — LEVALBUTEROL HCL 0.63 MG/3ML IN NEBU: 0.6300 mg | INHALATION_SOLUTION | RESPIRATORY_TRACT | Status: DC | PRN

## 2015-10-27 MED ORDER — ONDANSETRON HCL 4 MG/2ML IJ SOLN
INTRAMUSCULAR | Status: DC | PRN
  Administered 2015-10-27: 4 mg via INTRAVENOUS

## 2015-10-27 MED ORDER — EPHEDRINE SULFATE 50 MG/ML IJ SOLN
INTRAMUSCULAR | Status: AC
  Filled 2015-10-27: qty 1

## 2015-10-27 MED ORDER — ESCITALOPRAM OXALATE 20 MG PO TABS
20.0000 mg | ORAL_TABLET | Freq: Every day | ORAL | Status: DC
  Administered 2015-10-28: 20 mg via ORAL
  Filled 2015-10-27: qty 1

## 2015-10-27 MED ORDER — METHOCARBAMOL 500 MG PO TABS
500.0000 mg | ORAL_TABLET | Freq: Four times a day (QID) | ORAL | Status: DC | PRN
  Administered 2015-10-27 – 2015-10-28 (×3): 500 mg via ORAL
  Filled 2015-10-27 (×3): qty 1

## 2015-10-27 SURGICAL SUPPLY — 62 items
ADH SKN CLS APL DERMABOND .7 (GAUZE/BANDAGES/DRESSINGS) ×1
APPLIER CLIP 9.375 MED OPEN (MISCELLANEOUS) ×2
APR CLP MED 9.3 20 MLT OPN (MISCELLANEOUS) ×1
BINDER BREAST LRG (GAUZE/BANDAGES/DRESSINGS) ×1 IMPLANT
BINDER BREAST XLRG (GAUZE/BANDAGES/DRESSINGS) ×1 IMPLANT
CANISTER SUCTION 2500CC (MISCELLANEOUS) ×2 IMPLANT
CHLORAPREP W/TINT 26ML (MISCELLANEOUS) ×3 IMPLANT
CLIP APPLIE 9.375 MED OPEN (MISCELLANEOUS) ×1 IMPLANT
CONT SPEC 4OZ CLIKSEAL STRL BL (MISCELLANEOUS) ×2 IMPLANT
COVER PROBE W GEL 5X96 (DRAPES) ×2 IMPLANT
COVER SURGICAL LIGHT HANDLE (MISCELLANEOUS) ×2 IMPLANT
DERMABOND ADVANCED (GAUZE/BANDAGES/DRESSINGS) ×1
DERMABOND ADVANCED .7 DNX12 (GAUZE/BANDAGES/DRESSINGS) ×1 IMPLANT
DEVICE DISSECT PLASMABLAD 3.0S (MISCELLANEOUS) ×1 IMPLANT
DRAIN CHANNEL 19F RND (DRAIN) ×2 IMPLANT
DRAPE CHEST BREAST 15X10 FENES (DRAPES) ×2 IMPLANT
DRAPE PROXIMA HALF (DRAPES) ×2 IMPLANT
DRAPE UTILITY XL STRL (DRAPES) ×2 IMPLANT
DRSG PAD ABDOMINAL 8X10 ST (GAUZE/BANDAGES/DRESSINGS) ×1 IMPLANT
ELECT BLADE 4.0 EZ CLEAN MEGAD (MISCELLANEOUS) ×2
ELECT CAUTERY BLADE 6.4 (BLADE) ×2 IMPLANT
ELECT REM PT RETURN 9FT ADLT (ELECTROSURGICAL) ×2
ELECTRODE BLDE 4.0 EZ CLN MEGD (MISCELLANEOUS) ×1 IMPLANT
ELECTRODE REM PT RTRN 9FT ADLT (ELECTROSURGICAL) ×2 IMPLANT
EVACUATOR SILICONE 100CC (DRAIN) ×2 IMPLANT
FILTER STRAW FLUID ASPIR (MISCELLANEOUS) ×1 IMPLANT
GLOVE BIOGEL PI IND STRL 6.5 (GLOVE) IMPLANT
GLOVE BIOGEL PI IND STRL 7.5 (GLOVE) IMPLANT
GLOVE BIOGEL PI IND STRL 8 (GLOVE) IMPLANT
GLOVE BIOGEL PI INDICATOR 6.5 (GLOVE) ×3
GLOVE BIOGEL PI INDICATOR 7.5 (GLOVE) ×4
GLOVE BIOGEL PI INDICATOR 8 (GLOVE) ×1
GLOVE EUDERMIC 7 POWDERFREE (GLOVE) ×1 IMPLANT
GLOVE SURG SS PI 6.5 STRL IVOR (GLOVE) ×1 IMPLANT
GLOVE SURG SS PI 7.0 STRL IVOR (GLOVE) ×4 IMPLANT
GLOVE SURG SS PI 7.5 STRL IVOR (GLOVE) ×1 IMPLANT
GOWN STRL REUS W/ TWL LRG LVL3 (GOWN DISPOSABLE) ×2 IMPLANT
GOWN STRL REUS W/ TWL XL LVL3 (GOWN DISPOSABLE) ×1 IMPLANT
GOWN STRL REUS W/TWL LRG LVL3 (GOWN DISPOSABLE) ×14
GOWN STRL REUS W/TWL XL LVL3 (GOWN DISPOSABLE) ×2
KIT BASIN OR (CUSTOM PROCEDURE TRAY) ×2 IMPLANT
KIT ROOM TURNOVER OR (KITS) ×2 IMPLANT
NDL 18GX1X1/2 (RX/OR ONLY) (NEEDLE) ×1 IMPLANT
NDL HYPO 25GX1X1/2 BEV (NEEDLE) ×1 IMPLANT
NEEDLE 18GX1X1/2 (RX/OR ONLY) (NEEDLE) ×2 IMPLANT
NEEDLE HYPO 25GX1X1/2 BEV (NEEDLE) ×2 IMPLANT
NS IRRIG 1000ML POUR BTL (IV SOLUTION) ×4 IMPLANT
PACK GENERAL/GYN (CUSTOM PROCEDURE TRAY) ×2 IMPLANT
PAD ARMBOARD 7.5X6 YLW CONV (MISCELLANEOUS) ×2 IMPLANT
PLASMABLADE 3.0S (MISCELLANEOUS)
SPECIMEN JAR X LARGE (MISCELLANEOUS) ×3 IMPLANT
SPONGE GAUZE 4X4 12PLY STER LF (GAUZE/BANDAGES/DRESSINGS) ×1 IMPLANT
SPONGE LAP 18X18 X RAY DECT (DISPOSABLE) ×2 IMPLANT
SUT ETHILON 3 0 FSL (SUTURE) ×4 IMPLANT
SUT MNCRL AB 3-0 PS2 18 (SUTURE) ×1 IMPLANT
SUT MNCRL AB 4-0 PS2 18 (SUTURE) ×3 IMPLANT
SUT SILK 2 0 FS (SUTURE) ×2 IMPLANT
SUT VIC AB 3-0 SH 18 (SUTURE) ×6 IMPLANT
SYR CONTROL 10ML LL (SYRINGE) ×2 IMPLANT
TOWEL OR 17X24 6PK STRL BLUE (TOWEL DISPOSABLE) ×2 IMPLANT
TOWEL OR 17X26 10 PK STRL BLUE (TOWEL DISPOSABLE) ×2 IMPLANT
TUBE CONNECTING 12X1/4 (SUCTIONS) ×1 IMPLANT

## 2015-10-27 NOTE — Anesthesia Procedure Notes (Addendum)
Anesthesia Regional Block:  Pectoralis block  Pre-Anesthetic Checklist: ,, timeout performed, Correct Patient, Correct Site, Correct Laterality, Correct Procedure, Correct Position, site marked, Risks and benefits discussed,  Surgical consent,  Pre-op evaluation,  At surgeon's request and post-op pain management  Laterality: Left and Right  Prep: chloraprep       Needles:  Injection technique: Single-shot  Needle Type: Echogenic Needle     Needle Length: 9cm 9 cm Needle Gauge: 21 and 21 G    Additional Needles:  Procedures: ultrasound guided (picture in chart) Pectoralis block Narrative:  Start time: 10/27/2015 7:02 AM End time: 10/27/2015 7:12 AM Injection made incrementally with aspirations every 5 mL.  Performed by: Personally  Anesthesiologist: HODIERNE, ADAM  Additional Notes: Bilateral PECS blocks performed.  Pt tolerated the procedure well.   Procedure Name: LMA Insertion Date/Time: 10/27/2015 7:36 AM Performed by: Izora Gala Pre-anesthesia Checklist: Patient identified, Emergency Drugs available, Suction available and Patient being monitored Patient Re-evaluated:Patient Re-evaluated prior to inductionOxygen Delivery Method: Circle system utilized Preoxygenation: Pre-oxygenation with 100% oxygen Intubation Type: IV induction Ventilation: Mask ventilation without difficulty LMA: LMA inserted LMA Size: 4.0 Number of attempts: 1 Placement Confirmation: positive ETCO2 Tube secured with: Tape Dental Injury: Teeth and Oropharynx as per pre-operative assessment

## 2015-10-27 NOTE — Transfer of Care (Signed)
Immediate Anesthesia Transfer of Care Note  Patient: Chelsea Patterson  Procedure(s) Performed: Procedure(s): RIGHT TOTAL MASTECTOMY WITH RIGHT SENTINEL LYMPH NODE BIOPSY; BLUE DYE INJECTION FOR LYMPHATIC MAPPING; LEFT PROPHYLACTIC TOTAL MASTECTOMY (Bilateral)  Patient Location: PACU  Anesthesia Type:General  Level of Consciousness: awake, oriented and patient cooperative  Airway & Oxygen Therapy: Patient Spontanous Breathing and Patient connected to nasal cannula oxygen  Post-op Assessment: Report given to RN, Post -op Vital signs reviewed and stable and Patient moving all extremities  Post vital signs: Reviewed and stable  Last Vitals:  Filed Vitals:   10/27/15 0628  BP: 127/58  Pulse: 77  Temp: 36.6 C  Resp: 20    Complications: No apparent anesthesia complications

## 2015-10-27 NOTE — Op Note (Addendum)
Patient Name:           Chelsea Patterson   Date of Surgery:        10/27/2015  Pre op Diagnosis:       Cancer right breast upper outer quadrant  Post op Diagnosis:    same  Procedure:                 Inject blue dye right breast                                      Right total mastectomy                                      Right axillary sentinel lymph node biopsy                                      Left prophylactic total mastectomy  Surgeon:                     Edsel Petrin. [name], M.D., FACS  Assistant:                      OR staff  Operative Indications:    This is a 60 year old white female with invasive cancer of the right breast upper outer quadrant and 2 other suspicious areas on imaging. She has now decided that she wants bilateral mastectomies. We have discussed this and I support her decision.      She was initially evaluated in the [facility] on October 22 by Dr. Lindi Adie, Dr. Sondra Come, and me. Her PCP is Dr. Shirline Frees. Her gynecologist is Dr. Joan Flores. .      No prior breast problems. Recent screening imaging studies showed calcifications in the central right breast upper outer quadrant somewhat posterior to the nipple. Small 2 mm area was biopsy showed invasive adenocarcinoma, receptor positive, HER-2 negative. A second density in the medial right breast, in a separate quadrant thought to represent benign calcifications but close follow-up was advised so we went ahead and biopsied that and it was somewhat indeterminate showing atrophic glands, cannot rule out malignancy reviewed by multiple pathologist including Dr. Lyndon Code. Subsequent MRI showed a third area of enhancement in the right breast laterally, somewhat more inferiorly. She declined a third biopsy and wanted to go ahead with mastectomy. Initially she was willing to have double lumpectomy and sentinel node but she has completely changed her opinion about that and I can understand.    We talked for a while about immediate  and delayed reconstruction. She stated that she clearly does not want any reconstruction at this point in time.       Family history reveals father died of Merkel cell cancer. Uncle died of colon cancer. A second uncle had bone cancer. A cousin had stomach cancer. A grandmother had thyroid cancer. There is no breast or ovarian cancer.       Comorbidities include depression and anxiety on medications, COPD, ongoing tobacco abuse which I have urged her to stop, hyperlipidemia. Surgery for ectopic pregnancy. Surgery for cervical disc disease. Ovarian cystectomy for a chocolate cyst. She has stopped her hormone replacement therapy.      She's going to  be scheduled for a right total mastectomy with right axillary sentinel node biopsy, left prophylactic total mastectomy, without reconstruction.   Operative Findings:      There was no gross palpable abnormality on either side.  There was no skin change.  I found 5 sentinel lymph nodes on the right axilla,  all of which were hot and blue but none of them looked or felt pathologically abnormal.  Procedure in Detail:          The patient was identified in the holding area.  She underwent injection of radionuclide by the nuclear medicine technician into the right breast subareolar area.  The patient was taken to the operating room and went underwent general anesthesia with LMA device.  Surgical timeout was performed.  Intravenous antibiotics were given.  Following alcohol prep I injected 5 mL of blue dye into the right breast, subareolar area.  This was methylene blue mixed with saline.  The breast was massaged for a few minutes.      We then prepped and draped the entire anterior lateral chest and neck.  Using a marking pin I marked the boundaries of the breast and the midline.  I marked mirror-image transverse elliptical incisions.  I performed the left mastectomy first and after completing that I performed a right mastectomy and the sentinel lymph node  biopsy on the right.     The technique of mastectomy on each side was essentially identical and so I'll dictate it just once.  Transverse elliptical incisions were made including the nipple and areola.  Skin flaps were raised superiorly to the infraclavicular area, medially to the parasternal area, inferiorly to the anterior rectus sheath and laterally to the anterior border of the latissimus dorsi muscle.  On each side I then dissected the breast tissue off of the pectoralis major and minor muscles.  On each side I marked the lateral skin margin with a silk  suture.  The mastectomy specimens were sent separately to the lab, and the pathologist was notified as to which side was prophylactic and which was known cancer.      I then used the neoprobe and dissected up into the right axilla.  I found 5 sentinel lymph nodes, all of which were very hot and very blue.  They were individually harvested and then sent separately to the lab for routine histology.  There was no palpable abnormality in the right axilla.     The wounds were irrigated with saline.  Hemostasis was excellent.    Two 19 French Blake drains were placed on the right and one drain placed on the left.  These were brought out inferolaterally and sutured the skin and connected to suction bulbs.  The incisions were closed in 2 layers, 3-0 Monocryl interrupted subcutaneous sutures and 4-0 Monocryl running subcuticular and Dermabond.  Dry bandages and a breast binder were placed.  The patient tolerated the procedure well was taken to PACU in stable condition.  EBL 75 mL or less.  Counts correct.  Complications none.    Edsel Petrin. [name], M.D., FACS General and Minimally Invasive Surgery Breast and Colorectal Surgery  10/27/2015 10:53 AM

## 2015-10-27 NOTE — Progress Notes (Signed)
PHARMACIST - PHYSICIAN ORDER COMMUNICATION  CONCERNING: P&T Medication Policy on Herbal Medications  DESCRIPTION:  This patient's order for:  Collagen with vitamin C  has been noted.  This product(s) is classified as an "herbal" or natural product. Due to a lack of definitive safety studies or FDA approval, nonstandard manufacturing practices, plus the potential risk of unknown drug-drug interactions while on inpatient medications, the Pharmacy and Therapeutics Committee does not permit the use of "herbal" or natural products of this type within [facility].   ACTION TAKEN: The pharmacy department is unable to verify this order at this time and your patient has been informed of this safety policy. Please reevaluate patient's clinical condition at discharge and address if the herbal or natural product(s) should be resumed at that time.  [place], Pharm.D., BCPS Clinical Pharmacist Pager: [phone] 10/27/2015 4:12 PM

## 2015-10-27 NOTE — Interval H&P Note (Signed)
History and Physical Interval Note:  10/27/2015 7:01 AM  Chelsea Patterson  has presented today for surgery, with the diagnosis of RIGHT BREAST CANCER  The various methods of treatment have been discussed with the patient and family. After consideration of risks, benefits and other options for treatment, the patient has consented to  Procedure(s): BILATERAL TOTAL MASTECTOMY WITH RIGHT SENTINEL LYMPH NODE BIOPSY (left mastectomy prophylactic)  as a surgical intervention .  The patient's history has been reviewed, patient examined, no change in status, stable for surgery.  I have reviewed the patient's chart and labs.  Questions were answered to the patient's satisfaction.     [name]

## 2015-10-27 NOTE — Anesthesia Preprocedure Evaluation (Signed)
Anesthesia Evaluation  Patient identified by MRN, date of birth, ID band Patient awake    Reviewed: Allergy & Precautions, NPO status , Patient's Chart, lab work & pertinent test results  Airway Mallampati: II   Neck ROM: full    Dental   Pulmonary COPD, Current Smoker,    breath sounds clear to auscultation       Cardiovascular negative cardio ROS   Rhythm:regular Rate:Normal     Neuro/Psych Anxiety Depression    GI/Hepatic GERD  ,  Endo/Other    Renal/GU      Musculoskeletal  (+) Arthritis ,   Abdominal   Peds  Hematology   Anesthesia Other Findings   Reproductive/Obstetrics                             Anesthesia Physical Anesthesia Plan  ASA: II  Anesthesia Plan: General and Regional   Post-op Pain Management:  Regional for Post-op pain   Induction: Intravenous  Airway Management Planned: Oral ETT  Additional Equipment:   Intra-op Plan:   Post-operative Plan: Extubation in OR  Informed Consent: I have reviewed the patients History and Physical, chart, labs and discussed the procedure including the risks, benefits and alternatives for the proposed anesthesia with the patient or authorized representative who has indicated his/her understanding and acceptance.     Plan Discussed with: CRNA, Anesthesiologist and Surgeon  Anesthesia Plan Comments:         Anesthesia Quick Evaluation

## 2015-10-28 ENCOUNTER — Inpatient Hospital Stay: Admission: RE | Admit: 2015-10-28 | Payer: 59 | Source: Ambulatory Visit | Admitting: Radiation Oncology

## 2015-10-28 ENCOUNTER — Institutional Professional Consult (permissible substitution): Payer: 59 | Admitting: Radiation Oncology

## 2015-10-28 ENCOUNTER — Encounter (HOSPITAL_COMMUNITY): Payer: Self-pay | Admitting: General Surgery

## 2015-10-28 DIAGNOSIS — C50411 Malignant neoplasm of upper-outer quadrant of right female breast: Secondary | ICD-10-CM | POA: Diagnosis not present

## 2015-10-28 MED ORDER — FENTANYL CITRATE (PF) 100 MCG/2ML IJ SOLN: 12.5000 ug | INTRAMUSCULAR | Status: DC | PRN

## 2015-10-28 NOTE — Progress Notes (Signed)
1 Day Post-Op  Subjective: Alert and stable. Severe nausea with Dilaudid.  No problems with OxyIR. Ambulating to bathroom once. Says she is sore but pain seems under good control. Operative findings and events discussed with patient.  She seems pleased so far Right now, she says she wants to stay until tomorrow. Her mother is going to stay with her at home.  Objective: Vital signs in last 24 hours: Temp:  [97.3 F (36.3 C)-98.9 F (37.2 C)] 98.5 F (36.9 C) (04/12 0207) Pulse Rate:  [72-89] 72 (04/12 0207) Resp:  [8-22] 17 (04/12 0207) BP: (96-127)/(44-77) 121/44 mmHg (04/12 0207) SpO2:  [96 %-100 %] 97 % (04/12 0207) Weight:  [65.772 kg (145 lb)] 65.772 kg (145 lb) (04/11 0628)    Intake/Output from previous day: 04/11 0701 - 04/12 0700 In: 2150 [P.O.:150; I.V.:2000] Out: 1780 [Urine:1300; Emesis/NG output:100; Drains:280; Blood:100] Intake/Output this shift: Total I/O In: -  Out: 1370 [Urine:1300; Drains:70]   EXAM: General appearance: Alert and stable.  Cooperative.  No distress.  Mental status normal skin warm and dry Lungs: Clear to auscultation bilaterally Chest wall: Bilateral mastectomy wounds look good.  Skin flaps.  Viable.  No skin necrosis or ischemic changes.  No hematoma or swelling.  All drains functioning with thin serosanguineous drainage.  No arm swelling.  No sensory deficit arms.   Lab Results:  Results for orders placed or performed during the hospital encounter of 10/27/15 (from the past 24 hour(s))  CBC WITH DIFFERENTIAL     Status: None   Collection Time: 10/27/15  6:21 AM  Result Value Ref Range   WBC 8.0 4.0 - 10.5 K/uL   RBC 3.98 3.87 - 5.11 MIL/uL   Hemoglobin 13.2 12.0 - 15.0 g/dL   HCT 37.9 36.0 - 46.0 %   MCV 95.2 78.0 - 100.0 fL   MCH 33.2 26.0 - 34.0 pg   MCHC 34.8 30.0 - 36.0 g/dL   RDW 13.0 11.5 - 15.5 %   Platelets 299 150 - 400 K/uL   Neutrophils Relative % 53 %   Neutro Abs 4.2 1.7 - 7.7 K/uL   Lymphocytes Relative 38 %   Lymphs Abs 3.1 0.7 - 4.0 K/uL   Monocytes Relative 8 %   Monocytes Absolute 0.6 0.1 - 1.0 K/uL   Eosinophils Relative 1 %   Eosinophils Absolute 0.1 0.0 - 0.7 K/uL   Basophils Relative 0 %   Basophils Absolute 0.0 0.0 - 0.1 K/uL  Comprehensive metabolic panel     Status: None   Collection Time: 10/27/15  6:21 AM  Result Value Ref Range   Sodium 144 135 - 145 mmol/L   Potassium 4.6 3.5 - 5.1 mmol/L   Chloride 109 101 - 111 mmol/L   CO2 24 22 - 32 mmol/L   Glucose, Bld 96 65 - 99 mg/dL   BUN 11 6 - 20 mg/dL   Creatinine, Ser 0.84 0.44 - 1.00 mg/dL   Calcium 9.4 8.9 - 10.3 mg/dL   Total Protein 6.5 6.5 - 8.1 g/dL   Albumin 3.9 3.5 - 5.0 g/dL   AST 23 15 - 41 U/L   ALT 16 14 - 54 U/L   Alkaline Phosphatase 54 38 - 126 U/L   Total Bilirubin 0.5 0.3 - 1.2 mg/dL   GFR calc non Af Amer >60 >60 mL/min   GFR calc Af Amer >60 >60 mL/min   Anion gap 11 5 - 15  CBC     Status: Abnormal   Collection  Time: 10/27/15  4:26 PM  Result Value Ref Range   WBC 14.2 (H) 4.0 - 10.5 K/uL   RBC 3.50 (L) 3.87 - 5.11 MIL/uL   Hemoglobin 11.2 (L) 12.0 - 15.0 g/dL   HCT 33.7 (L) 36.0 - 46.0 %   MCV 96.3 78.0 - 100.0 fL   MCH 32.0 26.0 - 34.0 pg   MCHC 33.2 30.0 - 36.0 g/dL   RDW 13.0 11.5 - 15.5 %   Platelets 249 150 - 400 K/uL  Creatinine, serum     Status: None   Collection Time: 10/27/15  4:26 PM  Result Value Ref Range   Creatinine, Ser 0.78 0.44 - 1.00 mg/dL   GFR calc non Af Amer >60 >60 mL/min   GFR calc Af Amer >60 >60 mL/min     Studies/Results: Nm Sentinel Node Inj-no Rpt (breast)  10/27/2015  CLINICAL DATA: cancer right breast Sulfur colloid was injected intradermally by the nuclear medicine technologist for breast cancer sentinel node localization.    Marland Kitchen acidophilus  1 capsule Oral Daily  . atorvastatin  40 mg Oral q1800  . B-complex with vitamin C  1 tablet Oral Daily  . cholecalciferol  4,000 Units Oral Daily  . enoxaparin (LOVENOX) injection  40 mg Subcutaneous Q24H  .  escitalopram  20 mg Oral Daily  . multivitamin with minerals  1 tablet Oral Daily  . pantoprazole  40 mg Oral Daily  . pneumococcal 23 valent vaccine  0.5 mL Intramuscular Tomorrow-1000  . tiotropium  18 mcg Inhalation Daily  . valACYclovir  500 mg Oral Daily     Assessment/Plan: s/p Procedure(s): RIGHT TOTAL MASTECTOMY WITH RIGHT SENTINEL LYMPH NODE BIOPSY; BLUE DYE INJECTION FOR LYMPHATIC MAPPING; LEFT PROPHYLACTIC TOTAL MASTECTOMY  POD #1.  Bilateral mastectomy with right sentinel lymph node biopsy. Satisfactory progress overnight Mobilize.  Advance diet and activities.  Each drain care. Anticipate discharge tomorrow unless she changes her mind and decides to go home this afternoon.  COPD, mild Tobacco abuse Anxiety and depression  @PROBHOSP @     Anastazia Creek M 10/28/2015  . .prob

## 2015-10-28 NOTE — Anesthesia Postprocedure Evaluation (Signed)
Anesthesia Post Note  Patient: Chelsea Patterson  Procedure(s) Performed: Procedure(s) (LRB): RIGHT TOTAL MASTECTOMY WITH RIGHT SENTINEL LYMPH NODE BIOPSY; BLUE DYE INJECTION FOR LYMPHATIC MAPPING; LEFT PROPHYLACTIC TOTAL MASTECTOMY (Bilateral)  Patient location during evaluation: PACU Anesthesia Type: General Level of consciousness: awake and alert and patient cooperative Pain management: pain level controlled Vital Signs Assessment: post-procedure vital signs reviewed and stable Respiratory status: spontaneous breathing and respiratory function stable Cardiovascular status: stable Anesthetic complications: no    Last Vitals:  Filed Vitals:   10/28/15 0207 10/28/15 0631  BP: 121/44 110/70  Pulse: 72 80  Temp: 36.9 C 37.3 C  Resp: 17 18    Last Pain:  Filed Vitals:   10/28/15 0632  PainSc: [place]

## 2015-10-28 NOTE — Progress Notes (Signed)
Pt. Refused a bath for this morning but still went ahead and set up the bathroom for her just incase she would like to get one in the afternoon. Pt. Stated she might go in there and wash up and brush teeth but she will see later on how she feels.

## 2015-10-29 DIAGNOSIS — C50411 Malignant neoplasm of upper-outer quadrant of right female breast: Secondary | ICD-10-CM | POA: Diagnosis not present

## 2015-10-29 NOTE — Progress Notes (Signed)
Patient discharged to home with instructions. 

## 2015-10-29 NOTE — Progress Notes (Signed)
Pt stated she is going home today . Pt will wait until they take the IV out a wash up right before she goes home . Things are set up for patient

## 2015-10-29 NOTE — Discharge Summary (Signed)
Patient ID: Chelsea Patterson [id number] 60 y.o. 62-Sep-1957  Admit date: 10/27/2015  Discharge date and time: 10/29/2015  Admitting Physician: Chelsea Patterson  Discharge Physician: Chelsea Patterson  Admission Diagnoses: RIGHT BREAST CANCER  Discharge Diagnoses: Multifocal invasive cancer right breast, pathologic stage TIc, N1a                                         Atypical lobular hyperplasia left breast                                         COPD, mild                                         Tobacco abuse                                         Anxiety and depression  Operations: Procedure(s): RIGHT TOTAL MASTECTOMY WITH RIGHT SENTINEL LYMPH NODE BIOPSY; BLUE DYE INJECTION FOR LYMPHATIC MAPPING; LEFT PROPHYLACTIC TOTAL MASTECTOMY  Admission Condition: good  Discharged Condition: good  Indication for Admission: This is a 60 year old white female with invasive cancer of the right breast upper outer quadrant and 2 other suspicious areas on imaging. She has now decided that she wants bilateral mastectomies. We have discussed this and I support her decision.  She was initially evaluated in the [facility] on October 22 by Dr. Lindi Patterson, Dr. Sondra Patterson, and me. Her PCP is Dr. Shirline Patterson. Her gynecologist is Dr. Joan Patterson. .  No prior breast problems. Recent screening imaging studies showed calcifications in the central right breast upper outer quadrant somewhat posterior to the nipple. Small 2 mm area was biopsy showed invasive adenocarcinoma, receptor positive, HER-2 negative. A second density in the medial right breast, in a separate quadrant thought to represent benign calcifications but close follow-up was advised so we went ahead and biopsied that and it was somewhat indeterminate showing atrophic glands, cannot rule out malignancy reviewed by multiple pathologist including Dr. Lyndon Patterson. Subsequent MRI showed a third area of enhancement in the right breast laterally, somewhat more  inferiorly. She declined a third biopsy and wanted to go ahead with mastectomy. Initially she was willing to have double lumpectomy and sentinel node but she has completely changed her opinion about that and I can understand.  We talked for a while about immediate and delayed reconstruction. She stated that she clearly does not want any reconstruction at this point in time.  Family history reveals father died of Merkel cell cancer. Uncle died of colon cancer. A second uncle had bone cancer. A cousin had stomach cancer. A grandmother had thyroid cancer. There is no breast or ovarian cancer.  Comorbidities include depression and anxiety on medications, COPD, ongoing tobacco abuse which I have urged her to stop, hyperlipidemia. Surgery for ectopic pregnancy. Surgery for cervical disc disease. Ovarian cystectomy for a chocolate cyst. She has stopped her hormone replacement therapy.  She's going to be scheduled for a right total mastectomy with right axillary sentinel node biopsy, left prophylactic total mastectomy, without reconstruction.   Hospital Course: On the day of admission  the patient was taken to the operating room and underwent bilateral total mastectomies and right axillary sentinel node biopsy.  The surgery was uneventful.      She progressed in her diet and activities fairly well.  We kept her an extra night because she has very little support at home.  She was ambulatory, tolerating a diet and anxious to go home by postoperative day 2.  At the time of discharge both of her mastectomy wounds looked very good.  Skin flaps were healthy and viable without signs of ischemia.  There is no hematoma or seroma.  The drainage thin and serosanguineous from all of the drains and they were all functioning.  Total drainage from all 3 drains was 305 mL per 24 hours.  She was moving her shoulders around fairly well.  She did not have any sensory deficit of either arm.     I printed a  copy of her pathology report and discussed it with her and gave her a copy.  In the left breast she has atypical lobular hyperplasia.  In the right breast she has at least 2 foci of invasive cancer, the largest 1.8 cm.  2 out of 6 lymph nodes had metastatic cancer.  Estrogen receptor and progesterone receptor strongly positive.  HER-2 negative.  Ki-67 3%.  Pathology stage TIc, N1a.     She will return to see me in 7-12 days.   We discussed drain and wound care extensively.  I encouraged her to keep her follow-up appointment with Dr. Lindi Patterson and Dr. Sondra Patterson to discuss the role of adjuvant therapies.  I told her she may or may not be offered chemotherapy and therefore may or may not need a Port-A-Cath which she understands.  I told that she may or may not be offered radiation therapy because of her positive axillary nodes.  I told her I did not see any advantage to any further surgery or lymph node dissection, but that we would discuss this at our multidisciplinary conference.      She is not having any significant pain.  She declines a prescription pain medication and states that she will take Tylenol or Advil.  Consults: None  Significant Diagnostic Studies: Surgical pathology, discussed above  Treatments: surgery: Bilateral total mastectomy, right axillary sentinel node biopsy.  Disposition: Home  Patient Instructions:    Medication List    TAKE these medications        ALEVE PO  Take 440-660 mg by mouth 4 (four) times daily as needed (for pain).     atorvastatin 40 MG tablet  Commonly known as:  LIPITOR  Take 40 mg by mouth daily at 6 PM.     b complex vitamins tablet  Take 1 tablet by mouth daily.     cholecalciferol 1000 units tablet  Commonly known as:  VITAMIN D  Take 4,000 Units by mouth daily.     COLLAGEN PLUS VITAMIN C PO  Take 1 tablet by mouth daily.     escitalopram 20 MG tablet  Commonly known as:  LEXAPRO  Take 20 mg by mouth daily.     fish oil-omega-3 fatty acids  1000 MG capsule  Take 2 g by mouth daily.     ibuprofen 200 MG tablet  Commonly known as:  ADVIL,MOTRIN  Take 800 mg by mouth every 8 (eight) hours as needed.     multivitamin tablet  Take 1 tablet by mouth daily.     omeprazole 40 MG capsule  Commonly known as:  PRILOSEC  Take 40 mg by mouth daily.     PHILLIPS 500 MG (LAX) Tabs  Generic drug:  Magnesium Oxide  Take 1 tablet by mouth daily as needed.     PROBIOTIC PO  Take 1 capsule by mouth daily.     SYSTANE OP  Place 1-2 drops into both eyes as needed (for dry eyes).     tiotropium 18 MCG inhalation capsule  Commonly known as:  SPIRIVA  Place 18 mcg into inhaler and inhale daily.     TYLENOL ARTHRITIS PAIN 650 MG CR tablet  Generic drug:  acetaminophen  Take 2,600 mg by mouth every 8 (eight) hours as needed for pain.     valACYclovir 500 MG tablet  Commonly known as:  VALTREX  Take 500 mg by mouth daily.     XOPENEX HFA 45 MCG/ACT inhaler  Generic drug:  levalbuterol  Inhale 2 puffs into the lungs every 4 (four) hours as needed for wheezing or shortness of breath.        Activity: Activity as discussed. Diet: low fat, low cholesterol diet Wound Care: as directed  Follow-up:  With Dr. [name] in 8-12 days.  Signed: Edsel Petrin. [name], M.D., FACS General and minimally invasive surgery Breast and Colorectal Surgery  10/29/2015, 7:15 AM

## 2015-11-03 ENCOUNTER — Encounter: Payer: Self-pay | Admitting: Hematology and Oncology

## 2015-11-03 ENCOUNTER — Telehealth: Payer: Self-pay | Admitting: *Deleted

## 2015-11-03 ENCOUNTER — Ambulatory Visit (HOSPITAL_BASED_OUTPATIENT_CLINIC_OR_DEPARTMENT_OTHER): Payer: 59 | Admitting: Hematology and Oncology

## 2015-11-03 VITALS — BP 162/69 | HR 100 | Temp 98.2°F | Resp 18 | Ht 67.0 in | Wt 140.8 lb

## 2015-11-03 DIAGNOSIS — C50411 Malignant neoplasm of upper-outer quadrant of right female breast: Secondary | ICD-10-CM

## 2015-11-03 NOTE — Progress Notes (Signed)
Patient Care Team: Shirline Frees, MD as PCP - General (Family Medicine) Kem Boroughs, FNP as Nurse Practitioner (Family Medicine) Sylvan Cheese, NP as Nurse Practitioner (Hematology and Oncology)  DIAGNOSIS: Breast cancer of upper-outer quadrant of right female breast [facility])   Staging form: Breast, AJCC 7th Edition     Clinical stage from 09/09/2015: Stage IA (T1a, N0, M0) - Unsigned   SUMMARY OF ONCOLOGIC HISTORY:   Breast cancer of upper-outer quadrant of right female breast ([place])   09/04/2015 Initial Diagnosis Right breast biopsy invasive ductal carcinoma with papillary features, grade 1, ER/PR positive HER-2 negative Ki-67 3%; 6 month follow-up mamm: stable left breast calcifications but new right breast calcifications 2 sets 2 mm size (only one set biopsied)   10/27/2015 Surgery Bilateral mastectomies, right mastectomy: IDC grade 1, 2 foci, largest measures 1.8 cm, 2/6 lymph nodes positive T1cN1 stage II a, ER/PR positive HER-2 negative Ki-67 3%; left mastectomy: A LH;     CHIEF COMPLIANT: Follow-up after bilateral mastectomies  INTERVAL HISTORY: Chelsea Patterson is a 60 year old with above-mentioned history right breast cancer underwent bilateral mastectomies and is here to discuss the final pathology report. She is slightly sore from recent surgery but appears to be doing quite well.  REVIEW OF SYSTEMS:   Constitutional: Denies fevers, chills or abnormal weight loss Eyes: Denies blurriness of vision Ears, nose, mouth, throat, and face: Denies mucositis or sore throat Respiratory: Denies cough, dyspnea or wheezes Cardiovascular: Denies palpitation, chest discomfort Gastrointestinal:  Denies nausea, heartburn or change in bowel habits Skin: Denies abnormal skin rashes Lymphatics: Denies new lymphadenopathy or easy bruising Neurological:Denies numbness, tingling or new weaknesses Behavioral/Psych: Mood is stable, no new changes  Extremities: No lower extremity  edema Breast: Recent bilateral mastectomies with drains All other systems were reviewed with the patient and are negative.  I have reviewed the past medical history, past surgical history, social history and family history with the patient and they are unchanged from previous note.  ALLERGIES:  is allergic to wellbutrin; latex; and prozac.  MEDICATIONS:  Current Outpatient Prescriptions  Medication Sig Dispense Refill  . acetaminophen (TYLENOL ARTHRITIS PAIN) 650 MG CR tablet Take 2,600 mg by mouth every 8 (eight) hours as needed for pain.     Marland Kitchen atorvastatin (LIPITOR) 40 MG tablet Take 40 mg by mouth daily at 6 PM.     . b complex vitamins tablet Take 1 tablet by mouth daily.    . cholecalciferol (VITAMIN D) 1000 UNITS tablet Take 4,000 Units by mouth daily.    . Collagen-Vitamin C (COLLAGEN PLUS VITAMIN C PO) Take 1 tablet by mouth daily.     Marland Kitchen escitalopram (LEXAPRO) 20 MG tablet Take 20 mg by mouth daily.     . fish oil-omega-3 fatty acids 1000 MG capsule Take 2 g by mouth daily.    Marland Kitchen ibuprofen (ADVIL,MOTRIN) 200 MG tablet Take 800 mg by mouth every 8 (eight) hours as needed.    . Magnesium Oxide (PHILLIPS) 500 MG (LAX) TABS Take 1 tablet by mouth daily as needed.    . Multiple Vitamin (MULTIVITAMIN) tablet Take 1 tablet by mouth daily.    . Naproxen Sodium (ALEVE PO) Take 440-660 mg by mouth 4 (four) times daily as needed (for pain).     Marland Kitchen omeprazole (PRILOSEC) 40 MG capsule Take 40 mg by mouth daily.    Vladimir Faster Glycol-Propyl Glycol (SYSTANE OP) Place 1-2 drops into both eyes as needed (for dry eyes).    . Probiotic  Product (PROBIOTIC PO) Take 1 capsule by mouth daily.     Marland Kitchen tiotropium (SPIRIVA) 18 MCG inhalation capsule Place 18 mcg into inhaler and inhale daily.    . valACYclovir (VALTREX) 500 MG tablet Take 500 mg by mouth daily.     Penne Lash HFA 45 MCG/ACT inhaler Inhale 2 puffs into the lungs every 4 (four) hours as needed for wheezing or shortness of breath.      No current  facility-administered medications for this visit.    PHYSICAL EXAMINATION: ECOG PERFORMANCE STATUS: 1 - Symptomatic but completely ambulatory  Filed Vitals:   11/03/15 1010  BP: 162/69  Pulse: 100  Temp: 98.2 F (36.8 C)  Resp: 18   Filed Weights   11/03/15 1010  Weight: 140 lb 12.8 oz (63.866 kg)    GENERAL:alert, no distress and comfortable SKIN: skin color, texture, turgor are normal, no rashes or significant lesions EYES: normal, Conjunctiva are pink and non-injected, sclera clear OROPHARYNX:no exudate, no erythema and lips, buccal mucosa, and tongue normal  NECK: supple, thyroid normal size, non-tender, without nodularity LYMPH:  no palpable lymphadenopathy in the cervical, axillary or inguinal LUNGS: clear to auscultation and percussion with normal breathing effort HEART: regular rate & rhythm and no murmurs and no lower extremity edema ABDOMEN:abdomen soft, non-tender and normal bowel sounds MUSCULOSKELETAL:no cyanosis of digits and no clubbing  NEURO: alert & oriented x 3 with fluent speech, no focal motor/sensory deficits EXTREMITIES: No lower extremity edema  LABORATORY DATA:  I have reviewed the data as listed   Chemistry      Component Value Date/Time   NA 144 10/27/2015 0621   NA 142 09/09/2015 1209   K 4.6 10/27/2015 0621   K 4.1 09/09/2015 1209   CL 109 10/27/2015 0621   CO2 24 10/27/2015 0621   CO2 29 09/09/2015 1209   BUN 11 10/27/2015 0621   BUN 10.2 09/09/2015 1209   CREATININE 0.78 10/27/2015 1626   CREATININE 0.9 09/09/2015 1209      Component Value Date/Time   CALCIUM 9.4 10/27/2015 0621   CALCIUM 9.4 09/09/2015 1209   ALKPHOS 54 10/27/2015 0621   ALKPHOS 54 09/09/2015 1209   AST 23 10/27/2015 0621   AST 17 09/09/2015 1209   ALT 16 10/27/2015 0621   ALT 11 09/09/2015 1209   BILITOT 0.5 10/27/2015 0621   BILITOT 0.45 09/09/2015 1209       Lab Results  Component Value Date   WBC 14.2* 10/27/2015   HGB 11.2* 10/27/2015   HCT 33.7*  10/27/2015   MCV 96.3 10/27/2015   PLT 249 10/27/2015   NEUTROABS 4.2 10/27/2015     ASSESSMENT & PLAN:  Breast cancer of upper-outer quadrant of right female breast ([place]) Bilateral mastectomies 10/27/2015: Right mastectomy: IDC grade 1, 2 foci, largest measures 1.8 cm, 2/6 lymph nodes positive T1cN1 stage II a, ER/PR positive HER-2 negative Ki-67 3%; left mastectomy: ALH;   Pathology counseling: I discussed the final pathology report of the patient provided  a copy of this report. I discussed the margins as well as lymph node surgeries. We also discussed the final staging along with previously performed ER/PR and HER-2/neu testing.  Recommendation: 1. Mammaprint testing to determine if she would benefit from systemic chemotherapy 2. Followed by adjuvant radiation 3. Followed by adjuvant antiestrogen therapy (will evaluate her for clinical trial [facility])  Return to clinic based upon Mammaprint test result.   No orders of the defined types were placed in this encounter.  The patient has a good understanding of the overall plan. she agrees with it. she will call with any problems that may develop before the next visit here.   Rulon Eisenmenger, MD 11/03/2015

## 2015-11-03 NOTE — Progress Notes (Signed)
Unable to get in to exam room prior to MD.  No assessment performed.  

## 2015-11-03 NOTE — Assessment & Plan Note (Signed)
Bilateral mastectomies 10/27/2015: Right mastectomy: IDC grade 1, 2 foci, largest measures 1.8 cm, 2/6 lymph nodes positive T1cN1 stage II a, ER/PR positive HER-2 negative Ki-67 3%; left mastectomy: ALH;   Pathology counseling: I discussed the final pathology report of the patient provided  a copy of this report. I discussed the margins as well as lymph node surgeries. We also discussed the final staging along with previously performed ER/PR and HER-2/neu testing.  Recommendation: 1. Mammaprint testing to determine if she would benefit from systemic chemotherapy 2. Followed by adjuvant radiation 3. Followed by adjuvant antiestrogen therapy (will evaluate her for clinical trial [facility])  Return to clinic based upon Mammaprint test result.

## 2015-11-03 NOTE — Telephone Encounter (Signed)
Receive order per Dr. Lindi Adie for  mammaprint testing. Requisition sent to pathology. Received by Varney Biles.

## 2015-11-11 ENCOUNTER — Encounter (HOSPITAL_COMMUNITY): Payer: Self-pay

## 2015-11-11 ENCOUNTER — Other Ambulatory Visit: Payer: Self-pay | Admitting: Hematology and Oncology

## 2015-11-11 DIAGNOSIS — C50411 Malignant neoplasm of upper-outer quadrant of right female breast: Secondary | ICD-10-CM

## 2015-11-16 ENCOUNTER — Other Ambulatory Visit: Payer: Self-pay | Admitting: Hematology and Oncology

## 2015-11-17 ENCOUNTER — Encounter: Payer: Self-pay | Admitting: Physical Therapy

## 2015-11-17 ENCOUNTER — Ambulatory Visit: Payer: 59 | Attending: General Surgery | Admitting: Physical Therapy

## 2015-11-17 ENCOUNTER — Other Ambulatory Visit: Payer: Self-pay | Admitting: Hematology and Oncology

## 2015-11-17 DIAGNOSIS — M25612 Stiffness of left shoulder, not elsewhere classified: Secondary | ICD-10-CM | POA: Insufficient documentation

## 2015-11-17 DIAGNOSIS — R222 Localized swelling, mass and lump, trunk: Secondary | ICD-10-CM | POA: Diagnosis present

## 2015-11-17 DIAGNOSIS — M25611 Stiffness of right shoulder, not elsewhere classified: Secondary | ICD-10-CM | POA: Diagnosis not present

## 2015-11-17 DIAGNOSIS — C50411 Malignant neoplasm of upper-outer quadrant of right female breast: Secondary | ICD-10-CM | POA: Insufficient documentation
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
## 2015-11-18 ENCOUNTER — Encounter (HOSPITAL_COMMUNITY): Payer: 59

## 2015-11-18 ENCOUNTER — Ambulatory Visit (HOSPITAL_COMMUNITY): Payer: 59

## 2015-11-19 ENCOUNTER — Ambulatory Visit
Admission: RE | Admit: 2015-11-19 | Discharge: 2015-11-19 | Disposition: A | Discharge status: Home / Self Care | Payer: 59 | Source: Ambulatory Visit | Attending: Radiation Oncology | Admitting: Radiation Oncology

## 2015-11-19 ENCOUNTER — Encounter: Payer: Self-pay | Admitting: Radiation Oncology

## 2015-11-19 VITALS — BP 113/72 | HR 96 | Temp 98.0°F | Ht 67.0 in | Wt 139.0 lb

## 2015-11-19 DIAGNOSIS — C50411 Malignant neoplasm of upper-outer quadrant of right female breast: Secondary | ICD-10-CM | POA: Insufficient documentation

## 2015-11-19 DIAGNOSIS — Z510 Encounter for antineoplastic radiation therapy: Secondary | ICD-10-CM | POA: Insufficient documentation

## 2015-11-19 NOTE — Progress Notes (Signed)
Location of Breast Cancer: Breast cancer of upper-outer quadrant of right female breast    Histology per Pathology Report:   10/27/15 Diagnosis 1. Breast, simple mastectomy, Left - LOBULAR NEOPLASIA (ATYPICAL LOBULAR HYPERPLASIA). - FIBROCYSTIC CHANGES WITH ADENOSIS AND CALCIFICATIONS. 2. Breast, simple mastectomy, Right - INVASIVE DUCTAL CARCINOMA, GRADE I/III, AT LEAST 2 FOCI, THE LARGER SPANS 1.8 CM. - LOBULAR NEOPLASIA (ATYPICAL LOBULAR HYPERPLASIA). - THERE IS NO EVIDENCE OF CARCINOMA IN 1 OF 1 LYMPH NODE (0/1). - THE SURGICAL RESECTION MARGINS ARE NEGATIVE FOR DUCTAL CARCINOMA. - SEE ONCOLOGY TABLE BELOW. 3. Lymph node, sentinel, biopsy, Right - METASTATIC CARCINOMA IN 1 OF 1 LYMPH NODE (1/1). 4. Lymph node, sentinel, biopsy, Right - THERE IS NO EVIDENCE OF CARCINOMA IN 1 OF 1 LYMPH NODE (0/1). 5. Lymph node, sentinel, biopsy, Right - THERE IS NO EVIDENCE OF CARCINOMA IN 1 OF 1 LYMPH NODE (0/1). 6. Lymph node, sentinel, biopsy, Right - THERE IS NO EVIDENCE OF CARCINOMA IN 1 OF 1 LYMPH NODE (0/1). 7. Lymph node, sentinel, biopsy, Right - METASTATIC CARCINOMA IN 1 OF 1 LYMPH NODE (1/1).  09/02/15 Diagnosis Breast, right, needle core biopsy - INVASIVE MAMMARY CARCINOMA. - SEE COMMENT.  Receptor Status: ER(95%), PR (95%), Her2-neu (negative)  Did patient present with symptoms (if so, please note symptoms) or was this found on screening mammography?: screening mammogram  Past/Anticipated interventions by surgeon, if any: 10/27/15 - Procedure: RIGHT TOTAL MASTECTOMY WITH RIGHT SENTINEL LYMPH NODE BIOPSY; BLUE DYE INJECTION FOR LYMPHATIC MAPPING; LEFT PROPHYLACTIC TOTAL MASTECTOMY;  Surgeon: Fanny Skates, MD;  Location: [facility];  Service: General;  Laterality: Bilateral;   Past/Anticipated interventions by medical oncology, if any: adjuvant antiestrogen therapy   Lymphedema issues, if any:  no    Pain issues, if any:  no but has some soreness in both sides of her chest.  Ob  GYN:  Patient was 10 years at first menses.  She has 1 child, she was 39 years with the birth of her first child.  She has used HRT for many years.  She does not have a family history of breast cancer.  SAFETY ISSUES:  Prior radiation? no  Pacemaker/ICD? no  Possible current pregnancy?no  Is the patient on methotrexate? no  Current Complaints / other details:  Patient started physical therapy yesterday.  BP 113/72 mmHg  Pulse 96  Temp(Src) 98 F (36.7 C) (Oral)  Ht _0  (1.702 m)  Wt 139 lb (63.05 kg)  BMI 21.77 kg/m2  LMP 09/15/2009 (Approximate)    Jacqulyn Liner, RN 11/19/2015,12:59 PM

## 2015-11-19 NOTE — Progress Notes (Signed)
Please see the Nurse Progress Note in the MD Initial Consult Encounter for this patient. 

## 2015-11-19 NOTE — Progress Notes (Signed)
Radiation Oncology         [phone] ________________________________  Name: Chelsea Patterson MRN: [id number]  Date: 11/19/2015  DOB: 1955/12/29  Follow-Up Visit Note  CC: Shirline Frees, MD  Fanny Skates, MD    ICD-9-CM ICD-10-CM   1. Breast cancer of upper-outer quadrant of right female breast ([place]) 174.4 C50.411    Diagnosis:  Stage II (mpT1c, pN1a) invasive ductal carcinoma Of the right breast  Narrative:  The patient returns today for routine follow-up.  I saw her last for initial consultation at breast clinic 09/09/2015. She had  bilateral mastectomy 10/27/2015. The left breast did not reveal cancer and the right breast revealed invasive ductal carcinoma with 2/5 positive lymph nodes. She is here today to discuss radiation treatment.   She denies any back pain. She denies lymphedema. She denies pain but she does have some soreness in both sides of her chest. She mentions that she started physical therapy yesterday.   ALLERGIES:  is allergic to wellbutrin; latex; and prozac.  Meds: Current Outpatient Prescriptions  Medication Sig Dispense Refill  . acetaminophen (TYLENOL ARTHRITIS PAIN) 650 MG CR tablet Take 2,600 mg by mouth every 8 (eight) hours as needed for pain.     Marland Kitchen atorvastatin (LIPITOR) 40 MG tablet Take 40 mg by mouth daily at 6 PM.     . b complex vitamins tablet Take 1 tablet by mouth daily.    . cholecalciferol (VITAMIN D) 1000 UNITS tablet Take 4,000 Units by mouth daily.    . Collagen-Vitamin C (COLLAGEN PLUS VITAMIN C PO) Take 1 tablet by mouth daily.     Marland Kitchen escitalopram (LEXAPRO) 20 MG tablet Take 20 mg by mouth daily.     . fish oil-omega-3 fatty acids 1000 MG capsule Take 2 g by mouth daily.    Marland Kitchen ibuprofen (ADVIL,MOTRIN) 200 MG tablet Take 800 mg by mouth every 8 (eight) hours as needed.    . Multiple Vitamin (MULTIVITAMIN) tablet Take 1 tablet by mouth daily.    . Naproxen Sodium (ALEVE PO) Take 440-660 mg by mouth 4 (four) times daily as needed (for  pain).     Marland Kitchen omeprazole (PRILOSEC) 40 MG capsule Take 40 mg by mouth daily.    Vladimir Faster Glycol-Propyl Glycol (SYSTANE OP) Place 1-2 drops into both eyes as needed (for dry eyes).    . Probiotic Product (PROBIOTIC PO) Take 1 capsule by mouth daily.     Marland Kitchen tiotropium (SPIRIVA) 18 MCG inhalation capsule Place 18 mcg into inhaler and inhale daily.    . valACYclovir (VALTREX) 500 MG tablet Take 500 mg by mouth daily.     Penne Lash HFA 45 MCG/ACT inhaler Inhale 2 puffs into the lungs every 4 (four) hours as needed for wheezing or shortness of breath.      No current facility-administered medications for this encounter.    Physical Findings: The patient is in no acute distress. Patient is alert and oriented.  height is 5\' 7"  (1.702 m) and weight is 139 lb (63.05 kg). Her oral temperature is 98 F (36.7 C). Her blood pressure is 113/72 and her pulse is 96. .  No significant changes. The lungs are clear to auscultation. The heart has a regular rhythm and rate. No palpable subclavicular or axillary adenopathy. She has bilateral mastectomy scars, both healing well. "Surgical glue remains in place". No signs of drainage or infection.  Lab Findings: Lab Results  Component Value Date   WBC 14.2* 10/27/2015   HGB 11.2*  10/27/2015   HCT 33.7* 10/27/2015   MCV 96.3 10/27/2015   PLT 249 10/27/2015    Radiographic Findings: Nm Sentinel Node Inj-no Rpt (breast)  10/27/2015  CLINICAL DATA: cancer right breast Sulfur colloid was injected intradermally by the nuclear medicine technologist for breast cancer sentinel node localization.    Impression:  Ms. Camilleri is a 60 yo female with invasive ductal carcinoma of the right breast. She is At risk for local regional recurrence given her node positivity. She would benefit from postmastectomy irradiation. Radiation fields would encompass the right chest wall as well as the axillary/supraclavicular region since she did not receive an axillary  dissection.  Plan:  I spoke to the patient today regarding her diagnosis and options for treatment. We discussed the process of simulation and the placement tattoos. We discussed 6.5 weeks of treatment as an outpatient. We discussed the possibility of asymptomatic lung damage. We discussed the low likelihood of secondary malignancies. We discussed the possible side effects including but not limited to skin redness, fatigue, permanent skin darkening, and breast swelling. This procedure has been fully reviewed with the patient and written informed consent has been obtained. Her planning appointment is scheduled 12/01/2015 at 1 pm. She will start treatment the following week.  She has a CT of the abdomen and pelvis 11/20/2015. She has a bone scan 11/26/2015. ____________________________________  Blair Promise, PhD, MD    This document serves as a record of services personally performed by Gery Pray, MD. It was created on his behalf by Lendon Collar, a trained medical scribe. The creation of this record is based on the scribe's personal observations and the provider's statements to them. This document has been checked and approved by the attending provider.

## 2015-11-20 ENCOUNTER — Encounter (HOSPITAL_COMMUNITY): Payer: Self-pay

## 2015-11-20 ENCOUNTER — Ambulatory Visit (HOSPITAL_COMMUNITY)
Admission: RE | Admit: 2015-11-20 | Discharge: 2015-11-20 | Disposition: A | Discharge status: Home / Self Care | Payer: 59 | Source: Ambulatory Visit | Attending: Hematology and Oncology | Admitting: Hematology and Oncology

## 2015-11-20 DIAGNOSIS — D3501 Benign neoplasm of right adrenal gland: Secondary | ICD-10-CM | POA: Diagnosis not present

## 2015-11-20 DIAGNOSIS — M5136 Other intervertebral disc degeneration, lumbar region: Secondary | ICD-10-CM | POA: Diagnosis not present

## 2015-11-20 DIAGNOSIS — M47896 Other spondylosis, lumbar region: Secondary | ICD-10-CM | POA: Insufficient documentation

## 2015-11-20 DIAGNOSIS — I700 Atherosclerosis of aorta: Secondary | ICD-10-CM | POA: Diagnosis not present

## 2015-11-20 DIAGNOSIS — N200 Calculus of kidney: Secondary | ICD-10-CM | POA: Insufficient documentation

## 2015-11-20 DIAGNOSIS — J432 Centrilobular emphysema: Secondary | ICD-10-CM | POA: Diagnosis not present

## 2015-11-20 DIAGNOSIS — C50411 Malignant neoplasm of upper-outer quadrant of right female breast: Secondary | ICD-10-CM

## 2015-11-20 MED ORDER — IOPAMIDOL (ISOVUE-300) INJECTION 61%
100.0000 mL | Freq: Once | INTRAVENOUS | Status: AC | PRN
  Administered 2015-11-20: 100 mL via INTRAVENOUS

## 2015-11-24 ENCOUNTER — Ambulatory Visit: Payer: 59

## 2015-11-24 DIAGNOSIS — R222 Localized swelling, mass and lump, trunk: Secondary | ICD-10-CM

## 2015-11-24 DIAGNOSIS — M25612 Stiffness of left shoulder, not elsewhere classified: Secondary | ICD-10-CM

## 2015-11-24 DIAGNOSIS — M25611 Stiffness of right shoulder, not elsewhere classified: Secondary | ICD-10-CM
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
## 2015-11-24 NOTE — Patient Instructions (Signed)
CHEST: Doorway, Bilateral - Standing    Standing in doorway with one foot in front of other, place hands on wall with elbows bent at shoulder height. Lean forward. Hold _20-30__ seconds. _3__ reps per set, __2_ sets per day.  Copyright  VHI. All rights reserved.

## 2015-11-25 ENCOUNTER — Ambulatory Visit: Payer: 59

## 2015-11-25 DIAGNOSIS — M25611 Stiffness of right shoulder, not elsewhere classified: Secondary | ICD-10-CM | POA: Diagnosis not present

## 2015-11-25 DIAGNOSIS — R222 Localized swelling, mass and lump, trunk: Secondary | ICD-10-CM

## 2015-11-25 DIAGNOSIS — M25612 Stiffness of left shoulder, not elsewhere classified: Secondary | ICD-10-CM
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
## 2015-11-26 ENCOUNTER — Encounter (HOSPITAL_COMMUNITY)
Admission: RE | Admit: 2015-11-26 | Discharge: 2015-11-26 | Disposition: A | Discharge status: Home / Self Care | Payer: 59 | Source: Ambulatory Visit | Attending: Hematology and Oncology | Admitting: Hematology and Oncology

## 2015-11-26 DIAGNOSIS — C50411 Malignant neoplasm of upper-outer quadrant of right female breast: Secondary | ICD-10-CM | POA: Insufficient documentation

## 2015-11-26 MED ORDER — TECHNETIUM TC 99M MEDRONATE IV KIT
26.8000 | PACK | Freq: Once | INTRAVENOUS | Status: AC | PRN
  Administered 2015-11-26: 26.8 via INTRAVENOUS

## 2015-11-30 ENCOUNTER — Ambulatory Visit: Payer: 59

## 2015-11-30 DIAGNOSIS — R222 Localized swelling, mass and lump, trunk: Secondary | ICD-10-CM

## 2015-11-30 DIAGNOSIS — M25611 Stiffness of right shoulder, not elsewhere classified: Secondary | ICD-10-CM

## 2015-11-30 DIAGNOSIS — M25612 Stiffness of left shoulder, not elsewhere classified: Secondary | ICD-10-CM
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
## 2015-12-01 ENCOUNTER — Ambulatory Visit
Admission: RE | Admit: 2015-12-01 | Discharge: 2015-12-01 | Disposition: A | Discharge status: Home / Self Care | Payer: 59 | Source: Ambulatory Visit | Attending: Radiation Oncology | Admitting: Radiation Oncology

## 2015-12-01 DIAGNOSIS — C50411 Malignant neoplasm of upper-outer quadrant of right female breast: Secondary | ICD-10-CM

## 2015-12-01 DIAGNOSIS — Z510 Encounter for antineoplastic radiation therapy: Secondary | ICD-10-CM | POA: Diagnosis not present

## 2015-12-02 ENCOUNTER — Ambulatory Visit: Payer: 59 | Admitting: Physical Therapy

## 2015-12-02 ENCOUNTER — Telehealth: Payer: Self-pay | Admitting: Hematology and Oncology

## 2015-12-02 ENCOUNTER — Telehealth: Payer: Self-pay | Admitting: Oncology

## 2015-12-02 ENCOUNTER — Other Ambulatory Visit: Payer: Self-pay | Admitting: *Deleted

## 2015-12-02 DIAGNOSIS — M25611 Stiffness of right shoulder, not elsewhere classified: Secondary | ICD-10-CM | POA: Diagnosis not present

## 2015-12-02 DIAGNOSIS — M25612 Stiffness of left shoulder, not elsewhere classified: Secondary | ICD-10-CM
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
## 2015-12-02 NOTE — Telephone Encounter (Signed)
cld pt and left message of time & date of appt r/s to 02/02/16@11 

## 2015-12-02 NOTE — Telephone Encounter (Signed)
Melissa from [facility] called and said they need a prescription faxed for Chelsea Patterson for a compression sleeve.  The fax number is [phone].

## 2015-12-03 NOTE — Telephone Encounter (Signed)
Called Melissa back and advised her that the prescription should come for Dr. [name] as Moreen Fowler has not started radiation yet.

## 2015-12-07 DIAGNOSIS — Z510 Encounter for antineoplastic radiation therapy: Secondary | ICD-10-CM | POA: Diagnosis not present

## 2015-12-08 ENCOUNTER — Ambulatory Visit: Payer: 59

## 2015-12-08 ENCOUNTER — Ambulatory Visit
Admission: RE | Admit: 2015-12-08 | Discharge: 2015-12-08 | Disposition: A | Discharge status: Home / Self Care | Payer: 59 | Source: Ambulatory Visit | Attending: Radiation Oncology | Admitting: Radiation Oncology

## 2015-12-08 DIAGNOSIS — M25611 Stiffness of right shoulder, not elsewhere classified: Secondary | ICD-10-CM | POA: Diagnosis not present

## 2015-12-08 DIAGNOSIS — C50411 Malignant neoplasm of upper-outer quadrant of right female breast: Secondary | ICD-10-CM

## 2015-12-08 DIAGNOSIS — R222 Localized swelling, mass and lump, trunk: Secondary | ICD-10-CM

## 2015-12-08 DIAGNOSIS — Z510 Encounter for antineoplastic radiation therapy: Secondary | ICD-10-CM | POA: Diagnosis not present

## 2015-12-08 DIAGNOSIS — M25612 Stiffness of left shoulder, not elsewhere classified: Secondary | ICD-10-CM

## 2015-12-08 NOTE — Progress Notes (Signed)
  Radiation Oncology         [phone] ________________________________  Name: Chelsea Patterson MRN: [id number]  Date: 12/01/2015  DOB: [birth year]  SIMULATION AND TREATMENT PLANNING NOTE    ICD-9-CM ICD-10-CM   1. Breast cancer of upper-outer quadrant of right female breast ([place]) 174.4 C50.411     DIAGNOSIS:Stage II (mpT1c, pN1a) invasive ductal carcinoma Of the right breast  NARRATIVE:  The patient was brought to the [facility].  Identity was confirmed.  All relevant records and images related to the planned course of therapy were reviewed.  The patient freely provided informed written consent to proceed with treatment after reviewing the details related to the planned course of therapy. The consent form was witnessed and verified by the simulation staff.  Then, the patient was set-up in a stable reproducible  supine position for radiation therapy.  CT images were obtained.  Surface markings were placed.  The CT images were loaded into the planning software.  Then the target and avoidance structures were contoured.  Treatment planning then occurred.  The radiation prescription was entered and confirmed.  Then, I designed and supervised the construction of a total of 5 medically necessary complex treatment devices.  I have requested : 3D Simulation  I have requested a DVH of the following structures: heart, lungs, spinal cord.  I have ordered:dose calc.  PLAN:  The patient will receive 50.4 Gy in 28 fractions Directed at the right chest wall area. The axillary area will receive 45 gray. The patient will then proceed with a mastectomy scar boost of 10 gray in 5 fractions  ________________________________  -----------------------------------  Blair Promise, PhD, MD
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
## 2015-12-08 NOTE — Progress Notes (Signed)
  Radiation Oncology         [phone] ________________________________  Name: KENZLEY COUGHLIN MRN: [id number]  Date: 12/01/2015  DOB: 03-19-1956  Optical Surface Tracking Plan:  Since intensity modulated radiotherapy (IMRT) and 3D conformal radiation treatment methods are predicated on accurate and precise positioning for treatment, intrafraction motion monitoring is medically necessary to ensure accurate and safe treatment delivery.  The ability to quantify intrafraction motion without excessive ionizing radiation dose can only be performed with optical surface tracking. Accordingly, surface imaging offers the opportunity to obtain 3D measurements of patient position throughout IMRT and 3D treatments without excessive radiation exposure.  I am ordering optical surface tracking for this patient's upcoming course of radiotherapy. ________________________________  Gery Pray, MD 12/08/2015 6:34 PM    Reference:   Particia Jasper, et al. Surface imaging-based analysis of intrafraction motion for breast radiotherapy patients.Journal of [place], n. 6, nov. 2014. ISSN [id number].   Available at: <http://www.jacmp.org/index.php/jacmp/article/view/4957>.

## 2015-12-08 NOTE — Progress Notes (Signed)
  Radiation Oncology         [phone] ________________________________  Name: Chelsea Patterson MRN: [id number]  Date: 12/08/2015  DOB: August 11, 1955  Simulation Verification Note    ICD-9-CM ICD-10-CM   1. Breast cancer of upper-outer quadrant of right female breast ([place]) 174.4 C50.411     Status: outpatient  NARRATIVE: The patient was brought to the treatment unit and placed in the planned treatment position. The clinical setup was verified. Then port films were obtained and uploaded to the radiation oncology medical record software.  The treatment beams were carefully compared against the planned radiation fields. The position location and shape of the radiation fields was reviewed. They targeted volume of tissue appears to be appropriately covered by the radiation beams. Organs at risk appear to be excluded as planned.  Based on my personal review, I approved the simulation verification. The patient's treatment will proceed as planned.  -----------------------------------  Blair Promise, PhD, MD

## 2015-12-09 ENCOUNTER — Ambulatory Visit
Admission: RE | Admit: 2015-12-09 | Discharge: 2015-12-09 | Disposition: A | Discharge status: Home / Self Care | Payer: 59 | Source: Ambulatory Visit | Attending: Radiation Oncology | Admitting: Radiation Oncology

## 2015-12-09 DIAGNOSIS — Z510 Encounter for antineoplastic radiation therapy: Secondary | ICD-10-CM | POA: Diagnosis not present

## 2015-12-10 ENCOUNTER — Ambulatory Visit: Payer: 59 | Admitting: Physical Therapy

## 2015-12-10 ENCOUNTER — Ambulatory Visit
Admission: RE | Admit: 2015-12-10 | Discharge: 2015-12-10 | Disposition: A | Discharge status: Home / Self Care | Payer: 59 | Source: Ambulatory Visit | Attending: Radiation Oncology | Admitting: Radiation Oncology

## 2015-12-10 ENCOUNTER — Encounter: Payer: Self-pay | Admitting: Physical Therapy

## 2015-12-10 DIAGNOSIS — M25611 Stiffness of right shoulder, not elsewhere classified: Secondary | ICD-10-CM

## 2015-12-10 DIAGNOSIS — Z510 Encounter for antineoplastic radiation therapy: Secondary | ICD-10-CM | POA: Diagnosis not present

## 2015-12-10 DIAGNOSIS — R222 Localized swelling, mass and lump, trunk: Secondary | ICD-10-CM

## 2015-12-10 DIAGNOSIS — M25612 Stiffness of left shoulder, not elsewhere classified: Secondary | ICD-10-CM
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
## 2015-12-11 ENCOUNTER — Ambulatory Visit
Admission: RE | Admit: 2015-12-11 | Discharge: 2015-12-11 | Disposition: A | Discharge status: Home / Self Care | Payer: 59 | Source: Ambulatory Visit | Attending: Radiation Oncology | Admitting: Radiation Oncology

## 2015-12-11 DIAGNOSIS — Z510 Encounter for antineoplastic radiation therapy: Secondary | ICD-10-CM | POA: Diagnosis not present

## 2015-12-15 ENCOUNTER — Ambulatory Visit
Admission: RE | Admit: 2015-12-15 | Discharge: 2015-12-15 | Disposition: A | Discharge status: Home / Self Care | Payer: 59 | Source: Ambulatory Visit | Attending: Radiation Oncology | Admitting: Radiation Oncology

## 2015-12-15 ENCOUNTER — Ambulatory Visit: Payer: 59

## 2015-12-15 DIAGNOSIS — M25611 Stiffness of right shoulder, not elsewhere classified: Secondary | ICD-10-CM

## 2015-12-15 DIAGNOSIS — Z510 Encounter for antineoplastic radiation therapy: Secondary | ICD-10-CM | POA: Diagnosis not present

## 2015-12-15 DIAGNOSIS — R222 Localized swelling, mass and lump, trunk: Secondary | ICD-10-CM

## 2015-12-15 DIAGNOSIS — C50411 Malignant neoplasm of upper-outer quadrant of right female breast: Secondary | ICD-10-CM

## 2015-12-15 DIAGNOSIS — M25612 Stiffness of left shoulder, not elsewhere classified: Secondary | ICD-10-CM
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
## 2015-12-16 ENCOUNTER — Telehealth: Payer: Self-pay | Admitting: *Deleted

## 2015-12-16 ENCOUNTER — Encounter: Payer: Self-pay | Admitting: Radiation Oncology

## 2015-12-16 ENCOUNTER — Ambulatory Visit
Admission: RE | Admit: 2015-12-16 | Discharge: 2015-12-16 | Disposition: A | Discharge status: Home / Self Care | Payer: 59 | Source: Ambulatory Visit | Attending: Radiation Oncology | Admitting: Radiation Oncology

## 2015-12-16 VITALS — BP 101/51 | HR 93 | Temp 98.5°F | Ht 67.0 in | Wt 141.5 lb

## 2015-12-16 DIAGNOSIS — C50411 Malignant neoplasm of upper-outer quadrant of right female breast: Secondary | ICD-10-CM

## 2015-12-16 DIAGNOSIS — Z510 Encounter for antineoplastic radiation therapy: Secondary | ICD-10-CM | POA: Diagnosis not present

## 2015-12-16 MED ORDER — ALRA NON-METALLIC DEODORANT (RAD-ONC)
1.0000 "application " | Freq: Once | TOPICAL | Status: AC
  Administered 2015-12-16: 1 via TOPICAL

## 2015-12-16 MED ORDER — RADIAPLEXRX EX GEL
Freq: Once | CUTANEOUS | Status: AC
  Administered 2015-12-16: 12:00:00 via TOPICAL

## 2015-12-16 NOTE — Progress Notes (Signed)
  Radiation Oncology         [phone] ________________________________  Name: Chelsea Patterson MRN: [id number]  Date: 12/16/2015  DOB: Dec 04, 1955  Weekly Radiation Therapy Management    ICD-9-CM ICD-10-CM   1. Breast cancer of upper-outer quadrant of right female breast ([place]) 174.4 C50.411      Current Dose: 9 Gy     Planned Dose:  60.4 Gy  Narrative . . . . . . . . The patient presents for routine under treatment assessment.                       Chelsea Patterson has completed 5 fractions to her right chest wall and subclavian area. She denies having pain and fatigue. The skin on her right chest wall and subclavian area is intact.                                 Set-up films were reviewed.                                 The chart was checked. Physical Findings. . .  height is 5\' 7"  (1.702 m) and weight is 141 lb 8 oz (64.184 kg). Her oral temperature is 98.5 F (36.9 C). Her blood pressure is 101/51 and her pulse is 93. . Weight essentially stable.  No significant changes. Lungs are clear to auscultation bilaterally. Heart has regular rate and rhythm. The right chest wall area shows no significant radiation reaction at this point. Impression . . . . . . . The patient is tolerating radiation. Plan . . . . . . . . . . . . Continue treatment as planned. I explained to the patient that the chlorine in pools could cause skin irritation in the treatment area.  ________________________________   Blair Promise, PhD, MD  This document serves as a record of services personally performed by Gery Pray, MD. It was created on his behalf by Darcus Austin, a trained medical scribe. The creation of this record is based on the scribe's personal observations and the provider's statements to them. This document has been checked and approved by the attending provider.

## 2015-12-16 NOTE — Telephone Encounter (Signed)
Left message for a return phone call to follow up after start of radiation.   

## 2015-12-16 NOTE — Progress Notes (Signed)
Pt here for patient teaching.  Pt given Radiation and You booklet, skin care instructions, Alra deodorant and Radiaplex gel. Pt reports they have not watched the Radiation Therapy Education video and has been given the link to watch at home. Reviewed areas of pertinence such as fatigue and skin changes . Pt able to give teach back of to pat skin and use unscented/gentle soap,apply Radiaplex bid, avoid applying anything to skin within 4 hours of treatment and to use an electric razor if they must shave. Pt demonstrated understanding and verbalizes understanding of information given and will contact nursing with any questions or concerns.

## 2015-12-16 NOTE — Progress Notes (Signed)
Jacari Dieckman has completed 5 fractions to her right chest wall and subclavian area.  She denies having pain and fatigue.  The skin on her right chest wall and subclavian area is intact.  BP 101/51 mmHg  Pulse 93  Temp(Src) 98.5 F (36.9 C) (Oral)  Ht 5\' 7"  (1.702 m)  Wt 141 lb 8 oz (64.184 kg)  BMI 22.16 kg/m2  LMP 09/15/2009 (Approximate)   Wt Readings from Last 3 Encounters:  12/16/15 141 lb 8 oz (64.184 kg)  11/19/15 139 lb (63.05 kg)  11/03/15 140 lb 12.8 oz (63.866 kg)

## 2015-12-17 ENCOUNTER — Ambulatory Visit
Admission: RE | Admit: 2015-12-17 | Discharge: 2015-12-17 | Disposition: A | Discharge status: Home / Self Care | Payer: 59 | Source: Ambulatory Visit | Attending: Radiation Oncology | Admitting: Radiation Oncology

## 2015-12-17 DIAGNOSIS — Z510 Encounter for antineoplastic radiation therapy: Secondary | ICD-10-CM | POA: Diagnosis not present

## 2015-12-18 ENCOUNTER — Ambulatory Visit
Admission: RE | Admit: 2015-12-18 | Discharge: 2015-12-18 | Disposition: A | Discharge status: Home / Self Care | Payer: 59 | Source: Ambulatory Visit | Attending: Radiation Oncology | Admitting: Radiation Oncology

## 2015-12-18 DIAGNOSIS — Z510 Encounter for antineoplastic radiation therapy: Secondary | ICD-10-CM | POA: Diagnosis not present

## 2015-12-21 ENCOUNTER — Ambulatory Visit
Admission: RE | Admit: 2015-12-21 | Discharge: 2015-12-21 | Disposition: A | Discharge status: Home / Self Care | Payer: 59 | Source: Ambulatory Visit | Attending: Radiation Oncology | Admitting: Radiation Oncology

## 2015-12-21 DIAGNOSIS — Z510 Encounter for antineoplastic radiation therapy: Secondary | ICD-10-CM | POA: Diagnosis not present

## 2015-12-22 ENCOUNTER — Encounter: Payer: Self-pay | Admitting: Radiation Oncology

## 2015-12-22 ENCOUNTER — Ambulatory Visit
Admission: RE | Admit: 2015-12-22 | Discharge: 2015-12-22 | Disposition: A | Discharge status: Home / Self Care | Payer: 59 | Source: Ambulatory Visit | Attending: Radiation Oncology | Admitting: Radiation Oncology

## 2015-12-22 VITALS — BP 109/68 | HR 88 | Temp 98.0°F | Ht 67.0 in | Wt 143.6 lb

## 2015-12-22 DIAGNOSIS — Z510 Encounter for antineoplastic radiation therapy: Secondary | ICD-10-CM | POA: Diagnosis not present

## 2015-12-22 DIAGNOSIS — C50411 Malignant neoplasm of upper-outer quadrant of right female breast: Secondary | ICD-10-CM

## 2015-12-22 NOTE — Progress Notes (Signed)
  Radiation Oncology         [phone] ________________________________  Name: Chelsea Patterson MRN: [id number]  Date: 12/22/2015  DOB: 04/06/1956  Weekly Radiation Therapy Management  Breast cancer of upper-outer quadrant of right female breast ([place]).  Current Dose: 16.2 Gy     Planned Dose:  60.4 Gy  Narrative . . . . . . . . The patient presents for routine under treatment assessment.                       Sravya Higley has completed 9 fractions to her right chest wall and subclavian. She denies having pain. She does report having fatigue. She also has a rash with bilisters on her hands that started on Thursday. She mentions that they are tender. She has already tried hydrocortisone and prednisone on her hands. The skin on her right chest wall is pink. She is using radiaplex. The patient takes Valtrex on a daily basis                                 Set-up films were reviewed.                                 The chart was checked. Physical Findings. . .  height is 5\' 7"  (1.702 m) and weight is 143 lb 9.6 oz (65.137 kg). Her oral temperature is 98 F (36.7 C). Her blood pressure is 109/68 and her pulse is 88. . Weight essentially stable.  No significant changes. Lungs are clear to auscultation bilaterally. Heart has regular rate and rhythm. The skin of her right chest wall is pink. Blister-like rashes or prominent along her left hand, no drainage. Impression . . . . . . . The patient is tolerating radiation. Plan . . . . . . . . . . . . Continue treatment as planned. I recommend that she tries Benadryl cream on her hands and oral Benadryl and follow up with [place] if it is not getting better later this week. She is a patient with [facility] dermatology.  ________________________________   Blair Promise, PhD, MD    This document serves as a record of services personally performed by Gery Pray, MD. It was created on his behalf by Lendon Collar, a trained medical scribe. The  creation of this record is based on the scribe's personal observations and the provider's statements to them. This document has been checked and approved by the attending provider.

## 2015-12-22 NOTE — Progress Notes (Signed)
Chelsea Patterson has completed 9 fractions to her right chest wall and subclavian.  She denies having pain.  She does report having fatigue.  She also has a rash with bilisters on her hands that started on Thursday.  The skin on her right breast is pink.  She is using radiaplex.  BP 109/68 mmHg  Pulse 88  Temp(Src) 98 F (36.7 C) (Oral)  Ht 5\' 7"  (1.702 m)  Wt 143 lb 9.6 oz (65.137 kg)  BMI 22.49 kg/m2  LMP 09/15/2009 (Approximate)   Wt Readings from Last 3 Encounters:  12/22/15 143 lb 9.6 oz (65.137 kg)  12/16/15 141 lb 8 oz (64.184 kg)  11/19/15 139 lb (63.05 kg)

## 2015-12-23 ENCOUNTER — Ambulatory Visit
Admission: RE | Admit: 2015-12-23 | Discharge: 2015-12-23 | Disposition: A | Discharge status: Home / Self Care | Payer: 59 | Source: Ambulatory Visit | Attending: Radiation Oncology | Admitting: Radiation Oncology

## 2015-12-23 DIAGNOSIS — Z510 Encounter for antineoplastic radiation therapy: Secondary | ICD-10-CM | POA: Diagnosis not present

## 2015-12-24 ENCOUNTER — Ambulatory Visit
Admission: RE | Admit: 2015-12-24 | Discharge: 2015-12-24 | Disposition: A | Discharge status: Home / Self Care | Payer: 59 | Source: Ambulatory Visit | Attending: Radiation Oncology | Admitting: Radiation Oncology

## 2015-12-24 DIAGNOSIS — Z510 Encounter for antineoplastic radiation therapy: Secondary | ICD-10-CM | POA: Diagnosis not present

## 2015-12-25 ENCOUNTER — Ambulatory Visit
Admission: RE | Admit: 2015-12-25 | Discharge: 2015-12-25 | Disposition: A | Discharge status: Home / Self Care | Payer: 59 | Source: Ambulatory Visit | Attending: Radiation Oncology | Admitting: Radiation Oncology

## 2015-12-25 DIAGNOSIS — Z510 Encounter for antineoplastic radiation therapy: Secondary | ICD-10-CM | POA: Diagnosis not present

## 2015-12-28 ENCOUNTER — Ambulatory Visit
Admission: RE | Admit: 2015-12-28 | Discharge: 2015-12-28 | Disposition: A | Discharge status: Home / Self Care | Payer: 59 | Source: Ambulatory Visit | Attending: Radiation Oncology | Admitting: Radiation Oncology

## 2015-12-28 DIAGNOSIS — Z510 Encounter for antineoplastic radiation therapy: Secondary | ICD-10-CM | POA: Diagnosis not present

## 2015-12-29 ENCOUNTER — Encounter: Payer: Self-pay | Admitting: Radiation Oncology

## 2015-12-29 ENCOUNTER — Ambulatory Visit
Admission: RE | Admit: 2015-12-29 | Discharge: 2015-12-29 | Disposition: A | Discharge status: Home / Self Care | Payer: 59 | Source: Ambulatory Visit | Attending: Radiation Oncology | Admitting: Radiation Oncology

## 2015-12-29 ENCOUNTER — Encounter: Payer: 59 | Admitting: Physical Therapy

## 2015-12-29 VITALS — BP 105/68 | HR 86 | Temp 98.2°F | Ht 67.0 in | Wt 143.5 lb

## 2015-12-29 DIAGNOSIS — Z510 Encounter for antineoplastic radiation therapy: Secondary | ICD-10-CM | POA: Diagnosis not present

## 2015-12-29 DIAGNOSIS — R5383 Other fatigue: Secondary | ICD-10-CM | POA: Diagnosis not present

## 2015-12-29 DIAGNOSIS — Z923 Personal history of irradiation: Secondary | ICD-10-CM | POA: Insufficient documentation

## 2015-12-29 DIAGNOSIS — C50411 Malignant neoplasm of upper-outer quadrant of right female breast: Secondary | ICD-10-CM | POA: Insufficient documentation

## 2015-12-29 DIAGNOSIS — L598 Other specified disorders of the skin and subcutaneous tissue related to radiation: Secondary | ICD-10-CM | POA: Diagnosis not present

## 2015-12-29 MED ORDER — SONAFINE EX EMUL
1.0000 "application " | Freq: Once | CUTANEOUS | Status: AC
  Administered 2015-12-29: 1 via TOPICAL

## 2015-12-29 NOTE — Progress Notes (Signed)
  Radiation Oncology         (336) [phone] ________________________________  Name: Chelsea Patterson MRN: [id number]  Date: 12/29/2015  DOB: 1955-07-22  Weekly Radiation Therapy Management    ICD-9-CM ICD-10-CM   1. Breast cancer of upper-outer quadrant of right female breast ([place]) 174.4 C50.411      Current Dose: 23.4 Gy     Planned Dose:  60.4 Gy  Narrative . . . . . . . . The patient presents for routine under treatment assessment.                                   Daily Bazar has completed 14 fractions to her right chest and subclavian area. She reports having burning in her right chest. She reports having fatigue. She also reports having itching in her upper right chest. She has been using hydrocortisone cream in this area and radiaplex to the rest of her right chest. The skin on her right shoulder and chest is red with dermatitis.                                  Set-up films were reviewed.                                 The chart was checked. Physical Findings. . .  height is 5\' 7"  (1.702 m) and weight is 143 lb 8 oz (65.091 kg). Her oral temperature is 98.2 F (36.8 C). Her blood pressure is 105/68 and her pulse is 86. . Lungs are clear to auscultation bilaterally. Heart has regular rate and rhythm. No palpable cervical, supraclavicular, or axillary adenopathy. Abdomen soft, non-tender, normal bowel sounds.The right chest wall area shows erythema and radiation dermatitis. No skin breakdown Impression . . . . . . . The patient is tolerating radiation. Plan . . . . . . . . . . . . Continue treatment as planned. Patient has been given sonafine to see If This Will Help Better with Her Itching.  ________________________________   Blair Promise, PhD, MD

## 2015-12-29 NOTE — Progress Notes (Signed)
Chelsea Patterson has completed 14 fractions to her right chest and subclavian area.  She reports having burning in her right chest.  She reports having fatigue.  She also reports having itching in her upper right chest.  She has been using hydrocortisone cream in this area and radiaplex to the rest of her right chest.  The skin on her right shoulder and chest is red with dermatitis.    BP 105/68 mmHg  Pulse 86  Temp(Src) 98.2 F (36.8 C) (Oral)  Ht 5\' 7"  (1.702 m)  Wt 143 lb 8 oz (65.091 kg)  BMI 22.47 kg/m2  LMP 09/15/2009 (Approximate)

## 2015-12-30 ENCOUNTER — Ambulatory Visit
Admission: RE | Admit: 2015-12-30 | Discharge: 2015-12-30 | Disposition: A | Discharge status: Home / Self Care | Payer: 59 | Source: Ambulatory Visit | Attending: Radiation Oncology | Admitting: Radiation Oncology

## 2015-12-30 DIAGNOSIS — Z510 Encounter for antineoplastic radiation therapy: Secondary | ICD-10-CM | POA: Diagnosis not present

## 2015-12-31 ENCOUNTER — Ambulatory Visit
Admission: RE | Admit: 2015-12-31 | Discharge: 2015-12-31 | Disposition: A | Discharge status: Home / Self Care | Payer: 59 | Source: Ambulatory Visit | Attending: Radiation Oncology | Admitting: Radiation Oncology

## 2015-12-31 ENCOUNTER — Other Ambulatory Visit: Payer: Self-pay | Admitting: Oncology

## 2015-12-31 DIAGNOSIS — Z510 Encounter for antineoplastic radiation therapy: Secondary | ICD-10-CM | POA: Diagnosis not present

## 2016-01-01 ENCOUNTER — Ambulatory Visit
Admission: RE | Admit: 2016-01-01 | Discharge: 2016-01-01 | Disposition: A | Discharge status: Home / Self Care | Payer: 59 | Source: Ambulatory Visit | Attending: Radiation Oncology | Admitting: Radiation Oncology

## 2016-01-01 DIAGNOSIS — Z510 Encounter for antineoplastic radiation therapy: Secondary | ICD-10-CM | POA: Diagnosis not present

## 2016-01-04 ENCOUNTER — Ambulatory Visit
Admission: RE | Admit: 2016-01-04 | Discharge: 2016-01-04 | Disposition: A | Discharge status: Home / Self Care | Payer: 59 | Source: Ambulatory Visit | Attending: Radiation Oncology | Admitting: Radiation Oncology

## 2016-01-04 DIAGNOSIS — Z510 Encounter for antineoplastic radiation therapy: Secondary | ICD-10-CM | POA: Diagnosis not present

## 2016-01-05 ENCOUNTER — Ambulatory Visit
Admission: RE | Admit: 2016-01-05 | Discharge: 2016-01-05 | Disposition: A | Discharge status: Home / Self Care | Payer: 59 | Source: Ambulatory Visit | Attending: Radiation Oncology | Admitting: Radiation Oncology

## 2016-01-05 ENCOUNTER — Encounter: Payer: Self-pay | Admitting: Radiation Oncology

## 2016-01-05 VITALS — BP 104/61 | HR 87 | Temp 98.2°F | Ht 67.0 in | Wt 140.5 lb

## 2016-01-05 DIAGNOSIS — C50411 Malignant neoplasm of upper-outer quadrant of right female breast: Secondary | ICD-10-CM

## 2016-01-05 DIAGNOSIS — Z510 Encounter for antineoplastic radiation therapy: Secondary | ICD-10-CM | POA: Diagnosis not present

## 2016-01-05 NOTE — Progress Notes (Signed)
  Radiation Oncology         [phone] ________________________________  Name: Chelsea Patterson MRN: [id number]  Date: 01/05/2016  DOB: Jan 06, 1956  Weekly Radiation Therapy Management    ICD-9-CM ICD-10-CM   1. Breast cancer of upper-outer quadrant of right female breast ([place]) 174.4 C50.411      Current Dose: 34.2 Gy     Planned Dose:  60.4 Gy  Narrative . . . . . . . . The patient presents for routine under treatment assessment.                                    Chelsea Patterson has completed 19 fractions to her right chest wall and subclavian area. She denies pain but is having some discomfort in her right chest wall. She reports having fatigue. She is using radiaplex and sonafine. The skin on her right shoulder and right chest is red with dermatitis.                                   Set-up films were reviewed.                                 The chart was checked. Physical Findings. . .  height is 5\' 7"  (1.702 m) and weight is 140 lb 8 oz (63.73 kg). Her oral temperature is 98.2 F (36.8 C). Her blood pressure is 104/61 and her pulse is 87. Her oxygen saturation is 98%. . Lungs are clear to auscultation bilaterally. Heart has regular rate and rhythm. No palpable cervical, supraclavicular, or axillary adenopathy. Abdomen soft, non-tender, normal bowel sounds. Radiation dermatitis in the upper inner quadrant of the right chest, no skin breakdown.   Impression . . . . . . . The patient is tolerating radiation. Plan . . . . . . . . . . . . Continue treatment as planned.  ________________________________   Blair Promise, PhD, MD  This document serves as a record of services personally performed by Gery Pray, MD. It was created on his behalf by Derek Mound, a trained medical scribe. The creation of this record is based on the scribe's personal observations and the provider's statements to them. This document has been checked and approved by the attending provider.

## 2016-01-05 NOTE — Progress Notes (Signed)
Chelsea Patterson has completed 19 fractions to her right chest wall and subclavian area.  She denies pain but is having some discomfort in her right chest wall.  She reports having fatigue.  She is using radiaplex and sonafine.  The skin on her right shoulder and right chest is red with dermatitis.  BP 104/61 mmHg  Pulse 87  Temp(Src) 98.2 F (36.8 C) (Oral)  Ht 5\' 7"  (1.702 m)  Wt 140 lb 8 oz (63.73 kg)  BMI 22.00 kg/m2  SpO2 98%  LMP 09/15/2009 (Approximate)   Wt Readings from Last 3 Encounters:  01/05/16 140 lb 8 oz (63.73 kg)  12/29/15 143 lb 8 oz (65.091 kg)  12/22/15 143 lb 9.6 oz (65.137 kg)

## 2016-01-06 ENCOUNTER — Ambulatory Visit
Admission: RE | Admit: 2016-01-06 | Discharge: 2016-01-06 | Disposition: A | Discharge status: Home / Self Care | Payer: 59 | Source: Ambulatory Visit | Attending: Radiation Oncology | Admitting: Radiation Oncology

## 2016-01-06 DIAGNOSIS — Z510 Encounter for antineoplastic radiation therapy: Secondary | ICD-10-CM | POA: Diagnosis not present

## 2016-01-07 ENCOUNTER — Encounter: Payer: 59 | Admitting: Physical Therapy

## 2016-01-07 ENCOUNTER — Ambulatory Visit
Admission: RE | Admit: 2016-01-07 | Discharge: 2016-01-07 | Disposition: A | Discharge status: Home / Self Care | Payer: 59 | Source: Ambulatory Visit | Attending: Radiation Oncology | Admitting: Radiation Oncology

## 2016-01-07 DIAGNOSIS — Z510 Encounter for antineoplastic radiation therapy: Secondary | ICD-10-CM | POA: Diagnosis not present

## 2016-01-08 ENCOUNTER — Ambulatory Visit
Admission: RE | Admit: 2016-01-08 | Discharge: 2016-01-08 | Disposition: A | Discharge status: Home / Self Care | Payer: 59 | Source: Ambulatory Visit | Attending: Radiation Oncology | Admitting: Radiation Oncology

## 2016-01-08 DIAGNOSIS — Z510 Encounter for antineoplastic radiation therapy: Secondary | ICD-10-CM | POA: Diagnosis not present

## 2016-01-11 ENCOUNTER — Ambulatory Visit
Admission: RE | Admit: 2016-01-11 | Discharge: 2016-01-11 | Disposition: A | Discharge status: Home / Self Care | Payer: 59 | Source: Ambulatory Visit | Attending: Radiation Oncology | Admitting: Radiation Oncology

## 2016-01-11 DIAGNOSIS — Z510 Encounter for antineoplastic radiation therapy: Secondary | ICD-10-CM | POA: Diagnosis not present

## 2016-01-12 ENCOUNTER — Ambulatory Visit
Admission: RE | Admit: 2016-01-12 | Discharge: 2016-01-12 | Disposition: A | Discharge status: Home / Self Care | Payer: 59 | Source: Ambulatory Visit | Attending: Radiation Oncology | Admitting: Radiation Oncology

## 2016-01-12 ENCOUNTER — Encounter: Payer: Self-pay | Admitting: Radiation Oncology

## 2016-01-12 ENCOUNTER — Ambulatory Visit: Payer: 59 | Admitting: Radiation Oncology

## 2016-01-12 VITALS — BP 112/93 | HR 52 | Temp 98.3°F | Resp 16 | Ht 67.0 in | Wt 142.0 lb

## 2016-01-12 DIAGNOSIS — Z510 Encounter for antineoplastic radiation therapy: Secondary | ICD-10-CM | POA: Diagnosis not present

## 2016-01-12 DIAGNOSIS — C50411 Malignant neoplasm of upper-outer quadrant of right female breast: Secondary | ICD-10-CM | POA: Insufficient documentation

## 2016-01-12 MED ORDER — RADIAPLEXRX EX GEL
Freq: Once | CUTANEOUS | Status: AC
  Administered 2016-01-12: 13:00:00 via TOPICAL

## 2016-01-12 MED ORDER — SONAFINE EX EMUL
1.0000 "application " | Freq: Two times a day (BID) | CUTANEOUS | Status: DC
  Administered 2016-01-12: 1 via TOPICAL

## 2016-01-12 NOTE — Progress Notes (Signed)
Chelsea Patterson has completed 24 fractions to her right chest wall and subclavian area. She denies pain but is having some discomfort in her right chest wall. She reports having fatigue. She is using radiaplex and sonafine. The skin on her right shoulder and right chest is red with dermatitis. BP 112/93 mmHg  Pulse 52  Temp(Src) 98.3 F (36.8 C) (Oral)  Resp 16  Ht 5\' 7"  (1.702 m)  Wt 142 lb (64.411 kg)  BMI 22.24 kg/m2  SpO2 99%  LMP 09/15/2009 (Approximate)

## 2016-01-12 NOTE — Progress Notes (Signed)
Author: Malena Edman, RN Service: (none) Author Type: Registered Nurse    Filed: 01/12/2016 11:22 AM Note Time: 01/12/2016 11:21 AM Status: Signed   Editor: Malena Edman, RN (Registered Nurse)     Expand All Collapse All   Chelsea Patterson has completed 24 fractions to her right chest wall and subclavian area. She denies pain but is having some discomfort in her right chest wall. She reports having fatigue. She is using radiaplex and sonafine. The skin on her right shoulder and right chest is red with dermatitis. BP 112/93 mmHg  Pulse 52  Temp(Src) 98.3 F (36.8 C) (Oral)  Resp 16  Ht 5\' 7"  (1.702 m)  Wt 142 lb (64.411 kg)  BMI 22.24 kg/m2  SpO2 99%  LMP 09/15/2009 (Approximate)

## 2016-01-12 NOTE — Progress Notes (Signed)
  Radiation Oncology         [phone] ________________________________  Name: Chelsea Patterson MRN: [id number]  Date: 01/12/2016  DOB: [birth year]  Electron Beam Simulation Note    ICD-9-CM ICD-10-CM   1. Breast cancer of upper-outer quadrant of right female breast ([place]) 174.4 C50.411     Status: outpatient  NARRATIVE: The patient was brought to the treatment unit and placed in the planned treatment position. The clinical setup was verified. Patient's right mastectomy scar was identified and she said set up of a custom electron cutout field directed at the mastectomy scar region with generous margin. Patient will be treated with a custom electron cutout field. She will receive  10 gray in 5 fractions. A special port plan is requested for treatment ------------------------------------------------ -----------------------------------  Blair Promise, PhD, MD  This document serves as a record of services personally performed by Gery Pray, MD. It was created on his behalf by Darcus Austin, a trained medical scribe. The creation of this record is based on the scribe's personal observations and the provider's statements to them. This document has been checked and approved by the attending provider.

## 2016-01-12 NOTE — Addendum Note (Signed)
Encounter addended by: Malena Edman, RN on: 01/12/2016  1:20 PM<BR>     Documentation filed: Inpatient MAR

## 2016-01-12 NOTE — Progress Notes (Signed)
  Radiation Oncology         (336) [phone] ________________________________  Name: Chelsea Patterson MRN: [id number]  Date: 01/12/2016  DOB: 1956-03-29  Weekly Radiation Therapy Management    ICD-9-CM ICD-10-CM   1. Breast cancer of upper-outer quadrant of right female breast ([place]) 174.4 C50.411      Current Dose: 43.2 Gy     Planned Dose:  60.4 Gy  Narrative . . . . . . . . The patient presents for routine under treatment assessment.  Chelsea Patterson has completed 24 fractions to her right chest wall and subclavian area. She denies pain, but is having some discomfort in her right chest wall. She reports having fatigue. She is using radiaplex and sonafine.                                  Set-up films were reviewed.                                 The chart was checked. Physical Findings. . .  height is 5\' 7"  (1.702 m) and weight is 142 lb (64.411 kg). Her oral temperature is 98.3 F (36.8 C). Her blood pressure is 112/93 and her pulse is 52. Her respiration is 16 and oxygen saturation is 99%. . Lungs are clear to auscultation bilaterally. Heart has regular rate and rhythm. The right chest wall has diffuse erythema, radiation dermatitis, and no skin breakdown. Impression . . . . . . . The patient is tolerating radiation. Plan . . . . . . . . . . . . Continue treatment as planned.  ________________________________   Blair Promise, PhD, MD  This document serves as a record of services personally performed by Gery Pray, MD. It was created on his behalf by Darcus Austin, a trained medical scribe. The creation of this record is based on the scribe's personal observations and the provider's statements to them. This document has been checked and approved by the attending provider.

## 2016-01-12 NOTE — Addendum Note (Signed)
Encounter addended by: Malena Edman, RN on: 01/12/2016  1:14 PM<BR>     Documentation filed: [employer], Orders

## 2016-01-13 ENCOUNTER — Ambulatory Visit: Payer: 59 | Admitting: Radiation Oncology

## 2016-01-13 ENCOUNTER — Ambulatory Visit
Admission: RE | Admit: 2016-01-13 | Discharge: 2016-01-13 | Disposition: A | Discharge status: Home / Self Care | Payer: 59 | Source: Ambulatory Visit | Attending: Radiation Oncology | Admitting: Radiation Oncology

## 2016-01-13 DIAGNOSIS — Z510 Encounter for antineoplastic radiation therapy: Secondary | ICD-10-CM | POA: Diagnosis not present

## 2016-01-13 NOTE — Progress Notes (Signed)
  Radiation Oncology         [phone]) [phone] ________________________________  Name: Chelsea Patterson MRN: [id number]  Date: 01/15/2016  DOB: [birth year]  Weekly Radiation Therapy Management    ICD-9-CM ICD-10-CM   1. Breast cancer of upper-outer quadrant of right female breast ([place]) 174.4 C50.411      Current Dose: 48.6 Gy     Planned Dose:  60.4 Gy  Narrative . . . . . . . . The patient presents for routine under treatment assessment.  Chelsea Patterson has completed 27 fractions to her right chest wall and subclavical area. She reports having burning on her right chest and is rating it at a 4/10. She reports having fatigue. She is using radiaplex, sonafine and hydrocortisone cream.                                     Set-up films were reviewed.                                 The chart was checked.  Physical Findings. . .  height is 5\' 7"  (1.702 m) and weight is 142 lb 3.2 oz (64.501 kg). Her oral temperature is 98.4 F (36.9 C). Her blood pressure is 105/65 and her pulse is 81. Her oxygen saturation is 100%. .The right chest wall has diffuse erythema with thining of the skin in the upper inner quadrant.  Faint erythema over the right supraclavicular area and upper back  Impression . . . . . . . The patient is tolerating radiation. Plan . . . . . . . . . . . . Continue treatment as planned. Neosporin prn skin peeling.  -----------------------------------  Eppie Gibson, MD   This document serves as a record of services personally performed by Eppie Gibson, MD. It was created on her behalf by Derek Mound, a trained medical scribe. The creation of this record is based on the scribe's personal observations and the provider's statements to them. This document has been checked and approved by the attending provider.

## 2016-01-14 ENCOUNTER — Encounter: Payer: 59 | Admitting: Physical Therapy

## 2016-01-14 ENCOUNTER — Ambulatory Visit
Admission: RE | Admit: 2016-01-14 | Discharge: 2016-01-14 | Disposition: A | Discharge status: Home / Self Care | Payer: 59 | Source: Ambulatory Visit | Attending: Radiation Oncology | Admitting: Radiation Oncology

## 2016-01-14 DIAGNOSIS — Z510 Encounter for antineoplastic radiation therapy: Secondary | ICD-10-CM | POA: Diagnosis not present

## 2016-01-15 ENCOUNTER — Ambulatory Visit: Payer: 59 | Admitting: Nurse Practitioner

## 2016-01-15 ENCOUNTER — Ambulatory Visit
Admission: RE | Admit: 2016-01-15 | Discharge: 2016-01-15 | Disposition: A | Discharge status: Home / Self Care | Payer: 59 | Source: Ambulatory Visit | Attending: Radiation Oncology | Admitting: Radiation Oncology

## 2016-01-15 ENCOUNTER — Encounter: Payer: Self-pay | Admitting: Nurse Practitioner

## 2016-01-15 ENCOUNTER — Ambulatory Visit (INDEPENDENT_AMBULATORY_CARE_PROVIDER_SITE_OTHER): Payer: 59 | Admitting: Nurse Practitioner

## 2016-01-15 ENCOUNTER — Encounter: Payer: Self-pay | Admitting: Radiation Oncology

## 2016-01-15 VITALS — BP 105/65 | HR 81 | Temp 98.4°F | Ht 67.0 in | Wt 142.2 lb

## 2016-01-15 VITALS — BP 118/72 | HR 60 | Resp 18 | Ht 67.0 in | Wt 142.0 lb

## 2016-01-15 DIAGNOSIS — C50411 Malignant neoplasm of upper-outer quadrant of right female breast: Secondary | ICD-10-CM

## 2016-01-15 DIAGNOSIS — Z0000 Encounter for general adult medical examination without abnormal findings: Secondary | ICD-10-CM

## 2016-01-15 DIAGNOSIS — Z510 Encounter for antineoplastic radiation therapy: Secondary | ICD-10-CM | POA: Diagnosis not present

## 2016-01-15 DIAGNOSIS — Z01419 Encounter for gynecological examination (general) (routine) without abnormal findings: Secondary | ICD-10-CM | POA: Diagnosis not present
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
## 2016-01-15 NOTE — Progress Notes (Signed)
Chelsea Patterson has completed 27 fractions to her right chest wall and subclavian area.  She reports having burning on her right chest and is rating it at a 4/10.  She reports having fatigue.  She is using radiaplex, sonafine and hydrocortisone cream.  The skin on her right chest is red.  She has a small amount of peeling on her right upper back.  Advised her to use neosporin on this area.  BP 105/65 mmHg  Pulse 81  Temp(Src) 98.4 F (36.9 C) (Oral)  Ht 5\' 7"  (1.702 m)  Wt 142 lb 3.2 oz (64.501 kg)  BMI 22.27 kg/m2  SpO2 100%  LMP 09/15/2009 (Approximate)   Wt Readings from Last 3 Encounters:  01/15/16 142 lb 3.2 oz (64.501 kg)  01/12/16 142 lb (64.411 kg)  01/05/16 140 lb 8 oz (63.73 kg)

## 2016-01-15 NOTE — Patient Instructions (Signed)

## 2016-01-15 NOTE — Progress Notes (Signed)
Encounter reviewed by Dr. Brook Amundson C. Silva.  

## 2016-01-16 LAB — HEPATITIS C ANTIBODY: HCV AB: NEGATIVE

## 2016-01-16 LAB — HIV ANTIBODY (ROUTINE TESTING W REFLEX): HIV 1&2 Ab, 4th Generation: NONREACTIVE

## 2016-01-18 ENCOUNTER — Ambulatory Visit: Payer: 59 | Admitting: Radiation Oncology

## 2016-01-18 ENCOUNTER — Ambulatory Visit: Admission: RE | Admit: 2016-01-18 | Payer: 59 | Source: Ambulatory Visit

## 2016-01-20 ENCOUNTER — Ambulatory Visit: Payer: 59 | Admitting: Radiation Oncology

## 2016-01-20 ENCOUNTER — Ambulatory Visit
Admission: RE | Admit: 2016-01-20 | Discharge: 2016-01-20 | Disposition: A | Discharge status: Home / Self Care | Payer: 59 | Source: Ambulatory Visit | Attending: Radiation Oncology | Admitting: Radiation Oncology

## 2016-01-20 ENCOUNTER — Ambulatory Visit: Payer: 59

## 2016-01-20 DIAGNOSIS — Z510 Encounter for antineoplastic radiation therapy: Secondary | ICD-10-CM | POA: Diagnosis not present

## 2016-01-21 ENCOUNTER — Ambulatory Visit
Admission: RE | Admit: 2016-01-21 | Discharge: 2016-01-21 | Disposition: A | Discharge status: Home / Self Care | Payer: 59 | Source: Ambulatory Visit | Attending: Radiation Oncology | Admitting: Radiation Oncology

## 2016-01-21 DIAGNOSIS — Z510 Encounter for antineoplastic radiation therapy: Secondary | ICD-10-CM | POA: Diagnosis not present

## 2016-01-22 ENCOUNTER — Ambulatory Visit
Admission: RE | Admit: 2016-01-22 | Discharge: 2016-01-22 | Disposition: A | Discharge status: Home / Self Care | Payer: 59 | Source: Ambulatory Visit | Attending: Radiation Oncology | Admitting: Radiation Oncology

## 2016-01-22 DIAGNOSIS — Z510 Encounter for antineoplastic radiation therapy: Secondary | ICD-10-CM | POA: Diagnosis not present

## 2016-01-25 ENCOUNTER — Ambulatory Visit
Admission: RE | Admit: 2016-01-25 | Discharge: 2016-01-25 | Disposition: A | Discharge status: Home / Self Care | Payer: 59 | Source: Ambulatory Visit | Attending: Radiation Oncology | Admitting: Radiation Oncology

## 2016-01-25 DIAGNOSIS — Z510 Encounter for antineoplastic radiation therapy: Secondary | ICD-10-CM | POA: Diagnosis not present

## 2016-01-26 ENCOUNTER — Ambulatory Visit
Admission: RE | Admit: 2016-01-26 | Discharge: 2016-01-26 | Disposition: A | Discharge status: Home / Self Care | Payer: 59 | Source: Ambulatory Visit | Attending: Radiation Oncology | Admitting: Radiation Oncology

## 2016-01-26 ENCOUNTER — Ambulatory Visit: Payer: 59

## 2016-01-26 ENCOUNTER — Encounter: Payer: Self-pay | Admitting: Radiation Oncology

## 2016-01-26 VITALS — BP 122/73 | HR 102 | Temp 98.6°F | Ht 67.0 in | Wt 144.5 lb

## 2016-01-26 DIAGNOSIS — Z510 Encounter for antineoplastic radiation therapy: Secondary | ICD-10-CM | POA: Diagnosis not present

## 2016-01-26 DIAGNOSIS — C50411 Malignant neoplasm of upper-outer quadrant of right female breast: Secondary | ICD-10-CM

## 2016-01-26 NOTE — Progress Notes (Signed)
Chelsea Patterson has completed 32 fractions to her right chest wall and subclavian area.  She reports having pain/tenderness in her left chest near her underarm area.  She has been taking aleve.  She denies having fatigue.  She is using radiaplex as directed.  The skin on her left chest, left upper back and left subclavian area is red.  She has a small peeling area under her left arm.  She is also wondering if she needs to wear her compression sleeve all the time.  She has been given a one month follow up appointment.  BP 122/73 mmHg  Pulse 102  Temp(Src) 98.6 F (37 C) (Oral)  Ht 5\' 7"  (1.702 m)  Wt 144 lb 8 oz (65.545 kg)  BMI 22.63 kg/m2  SpO2 98%  LMP 09/15/2009 (Approximate)   Wt Readings from Last 3 Encounters:  01/26/16 144 lb 8 oz (65.545 kg)  01/15/16 142 lb (64.411 kg)  01/15/16 142 lb 3.2 oz (64.501 kg)

## 2016-01-26 NOTE — Progress Notes (Signed)
  Radiation Oncology         [phone] ________________________________  Name: Chelsea Patterson MRN: [id number]  Date: 01/26/2016  DOB: 1956/05/18    Weekly Radiation Therapy Management  Current Dose: 58.4 Gy     Planned Dose:  60.4 Gy  Narrative . . . . . . . . The patient presents for routine under treatment assessment.                                   Chelsea Patterson has completed 32 fractions to her right chest wall and subclavian area. She reports having pain/tenderness in her right chest near her underarm area. She denies itching. She has been taking aleve. She denies having fatigue. She is using radiaplex as directed. The skin on her right chest, right upper back and right subclavian area is red. She has a small peeling area under her right arm. She is also wondering if she needs to wear her compression sleeve all the time. She has been given a one month follow up appointment.                                 Set-up films were reviewed.                                 The chart was checked. Physical Findings. . .  height is 5\' 7"  (1.702 m) and weight is 144 lb 8 oz (65.545 kg). Her oral temperature is 98.6 F (37 C). Her blood pressure is 122/73 and her pulse is 102. Her oxygen saturation is 98%. . Weight essentially stable.  Lungs are clear to auscultation bilaterally. Heart has regular rate and rhythm. No palpable cervical, supraclavicular, or axillary adenopathy. Abdomen soft, non-tender, normal bowel sounds. Diffuse erythema throughout the right chest and some hyperpigmentation changes with no moist desquamation.  Impression . . . . . . . The patient is tolerating radiation. Plan . . . . . . . . . . . . Continue treatment as planned. The patient is scheduled to complete treatment tomorrow and will return for follow up in 1 month.  ________________________________   Blair Promise, PhD, MD   This document serves as a record of services personally performed by Gery Pray,  MD. It was created on his behalf by Arlyce Harman, a trained medical scribe. The creation of this record is based on the scribe's personal observations and the provider's statements to them. This document has been checked and approved by the attending provider.

## 2016-01-27 ENCOUNTER — Ambulatory Visit: Payer: 59

## 2016-01-27 ENCOUNTER — Ambulatory Visit
Admission: RE | Admit: 2016-01-27 | Discharge: 2016-01-27 | Disposition: A | Discharge status: Home / Self Care | Payer: 59 | Source: Ambulatory Visit | Attending: Radiation Oncology | Admitting: Radiation Oncology

## 2016-01-27 ENCOUNTER — Encounter: Payer: Self-pay | Admitting: Radiation Oncology

## 2016-01-27 DIAGNOSIS — Z510 Encounter for antineoplastic radiation therapy: Secondary | ICD-10-CM | POA: Diagnosis not present

## 2016-01-28 ENCOUNTER — Telehealth: Payer: Self-pay | Admitting: *Deleted

## 2016-01-28 ENCOUNTER — Ambulatory Visit: Payer: 59

## 2016-01-28 NOTE — Telephone Encounter (Signed)
  Oncology Nurse Navigator Documentation    Navigator Encounter Type: Telephone (01/28/16 1800) Telephone: Outgoing Call (01/28/16 1800)         Patient Visit Type: [id number] (01/28/16 1800) Treatment Phase: Final Radiation Tx (01/28/16 1800)  Left vm to congratulate pt on completion of xrt.                           Time Spent with Patient: 15 (01/28/16 1800)

## 2016-02-02 ENCOUNTER — Ambulatory Visit (HOSPITAL_BASED_OUTPATIENT_CLINIC_OR_DEPARTMENT_OTHER): Payer: 59 | Admitting: Hematology and Oncology

## 2016-02-02 ENCOUNTER — Encounter: Payer: Self-pay | Admitting: Hematology and Oncology

## 2016-02-02 ENCOUNTER — Telehealth: Payer: Self-pay | Admitting: Hematology and Oncology

## 2016-02-02 VITALS — BP 102/64 | HR 104 | Temp 98.2°F | Resp 18 | Wt 143.4 lb

## 2016-02-02 DIAGNOSIS — C50411 Malignant neoplasm of upper-outer quadrant of right female breast: Secondary | ICD-10-CM | POA: Diagnosis not present

## 2016-02-02 DIAGNOSIS — Z79811 Long term (current) use of aromatase inhibitors: Secondary | ICD-10-CM

## 2016-02-02 DIAGNOSIS — Z170 Estrogen receptor positive status [ER+]: Secondary | ICD-10-CM | POA: Diagnosis not present

## 2016-02-02 MED ORDER — ANASTROZOLE 1 MG PO TABS: 1.0000 mg | ORAL_TABLET | Freq: Every day | ORAL | Status: DC

## 2016-02-02 NOTE — Progress Notes (Signed)
Patient Care Team: Shirline Frees, MD as PCP - General (Family Medicine) Kem Boroughs, FNP as Nurse Practitioner (Family Medicine) Sylvan Cheese, NP as Nurse Practitioner (Hematology and Oncology)  DIAGNOSIS: Breast cancer of upper-outer quadrant of right female breast [facility])   Staging form: Breast, AJCC 7th Edition     Clinical stage from 09/09/2015: Stage IA (T1a, N0, M0) - Unsigned   SUMMARY OF ONCOLOGIC HISTORY:   Breast cancer of upper-outer quadrant of right female breast ([place])   09/04/2015 Initial Diagnosis Right breast biopsy invasive ductal carcinoma with papillary features, grade 1, ER/PR positive HER-2 negative Ki-67 3%; 6 month follow-up mamm: stable left breast calcifications but new right breast calcifications 2 sets 2 mm size (only one set biopsied)   10/27/2015 Surgery Bilateral mastectomies, right mastectomy: IDC grade 1, 2 foci, largest measures 1.8 cm, 2/6 lymph nodes positive T1cN1 stage II a, ER/PR positive HER-2 negative Ki-67 3%; left mastectomy: A LH;    12/09/2015 - 01/27/2016 Radiation Therapy Adjuvant radiation therapy    CHIEF COMPLIANT: Follow-up after adjuvant radiation to start antiestrogen therapy  INTERVAL HISTORY: Chelsea Patterson is a 60 year old with above-mentioned history of right breast cancer who completed adjuvant radiation therapy. She still feels slightly sore in the breast as well as skin peeling. She is healing slowly over the past week. She does continue to feel fatigued. She is here to talk about the antiestrogen treatment.  REVIEW OF SYSTEMS:   Constitutional: Denies fevers, chills or abnormal weight loss Eyes: Denies blurriness of vision Ears, nose, mouth, throat, and face: Denies mucositis or sore throat Respiratory: Denies cough, dyspnea or wheezes Cardiovascular: Denies palpitation, chest discomfort Gastrointestinal:  Denies nausea, heartburn or change in bowel habits Skin: Denies abnormal skin rashes Lymphatics: Denies new  lymphadenopathy or easy bruising Neurological:Denies numbness, tingling or new weaknesses Behavioral/Psych: Mood is stable, no new changes  Extremities: No lower extremity edema, arthritis Breast: Soreness and skin peeling from recent radiation All other systems were reviewed with the patient and are negative.  I have reviewed the past medical history, past surgical history, social history and family history with the patient and they are unchanged from previous note.  ALLERGIES:  is allergic to wellbutrin; latex; and prozac.  MEDICATIONS:  Current Outpatient Prescriptions  Medication Sig Dispense Refill  . acetaminophen (TYLENOL ARTHRITIS PAIN) 650 MG CR tablet Take 2,600 mg by mouth every 8 (eight) hours as needed for pain. Reported on 12/22/2015    . anastrozole (ARIMIDEX) 1 MG tablet Take 1 tablet (1 mg total) by mouth daily. 90 tablet 3  . atorvastatin (LIPITOR) 40 MG tablet Take 40 mg by mouth daily at 6 PM.     . b complex vitamins tablet Take 1 tablet by mouth daily.    . cholecalciferol (VITAMIN D) 1000 UNITS tablet Take 4,000 Units by mouth daily. Reported on 01/26/2016    . Collagen-Vitamin C (COLLAGEN PLUS VITAMIN C PO) Take 1 tablet by mouth daily. Reported on 12/22/2015    . escitalopram (LEXAPRO) 20 MG tablet Take 20 mg by mouth daily.     . fish oil-omega-3 fatty acids 1000 MG capsule Take 2 g by mouth daily.    Marland Kitchen ibuprofen (ADVIL,MOTRIN) 200 MG tablet Take 800 mg by mouth every 8 (eight) hours as needed.    . Multiple Vitamin (MULTIVITAMIN) tablet Take 1 tablet by mouth daily.    . non-metallic deodorant Jethro Poling) MISC Apply 1 application topically daily as needed.    Marland Kitchen omeprazole (PRILOSEC) 40  MG capsule Take 40 mg by mouth daily.    Vladimir Faster Glycol-Propyl Glycol (SYSTANE OP) Place 1-2 drops into both eyes as needed (for dry eyes).    . Probiotic Product (PROBIOTIC PO) Take 1 capsule by mouth daily.     Marland Kitchen tiotropium (SPIRIVA) 18 MCG inhalation capsule Place 18 mcg into inhaler  and inhale daily.    . valACYclovir (VALTREX) 500 MG tablet Take 500 mg by mouth daily.     . Wound Cleansers (RADIAPLEX EX) Apply topically. Reported on 01/05/2016    . Wound Dressings (SONAFINE EX) Apply topically. Reported on 01/26/2016    . XOPENEX HFA 45 MCG/ACT inhaler Inhale 2 puffs into the lungs every 4 (four) hours as needed for wheezing or shortness of breath.      No current facility-administered medications for this visit.    PHYSICAL EXAMINATION: ECOG PERFORMANCE STATUS: 1 - Symptomatic but completely ambulatory  Filed Vitals:   02/02/16 1049  BP: 102/64  Pulse: 104  Temp: 98.2 F (36.8 C)  Resp: 18   Filed Weights   02/02/16 1049  Weight: 143 lb 6.4 oz (65.046 kg)    GENERAL:alert, no distress and comfortable SKIN: skin color, texture, turgor are normal, no rashes or significant lesions EYES: normal, Conjunctiva are pink and non-injected, sclera clear OROPHARYNX:no exudate, no erythema and lips, buccal mucosa, and tongue normal  NECK: supple, thyroid normal size, non-tender, without nodularity LYMPH:  no palpable lymphadenopathy in the cervical, axillary or inguinal LUNGS: clear to auscultation and percussion with normal breathing effort HEART: regular rate & rhythm and no murmurs and no lower extremity edema ABDOMEN:abdomen soft, non-tender and normal bowel sounds MUSCULOSKELETAL:no cyanosis of digits and no clubbing  NEURO: alert & oriented x 3 with fluent speech, no focal motor/sensory deficits EXTREMITIES: No lower extremity edema   LABORATORY DATA:  I have reviewed the data as listed   Chemistry      Component Value Date/Time   NA 144 10/27/2015 0621   NA 142 09/09/2015 1209   K 4.6 10/27/2015 0621   K 4.1 09/09/2015 1209   CL 109 10/27/2015 0621   CO2 24 10/27/2015 0621   CO2 29 09/09/2015 1209   BUN 11 10/27/2015 0621   BUN 10.2 09/09/2015 1209   CREATININE 0.78 10/27/2015 1626   CREATININE 0.9 09/09/2015 1209      Component Value Date/Time     CALCIUM 9.4 10/27/2015 0621   CALCIUM 9.4 09/09/2015 1209   ALKPHOS 54 10/27/2015 0621   ALKPHOS 54 09/09/2015 1209   AST 23 10/27/2015 0621   AST 17 09/09/2015 1209   ALT 16 10/27/2015 0621   ALT 11 09/09/2015 1209   BILITOT 0.5 10/27/2015 0621   BILITOT 0.45 09/09/2015 1209       Lab Results  Component Value Date   WBC 14.2* 10/27/2015   HGB 11.2* 10/27/2015   HCT 33.7* 10/27/2015   MCV 96.3 10/27/2015   PLT 249 10/27/2015   NEUTROABS 4.2 10/27/2015     ASSESSMENT & PLAN:  Breast cancer of upper-outer quadrant of right female breast ([place]) Bilateral mastectomies 10/27/2015: Right mastectomy: IDC grade 1, 2 foci, largest measures 1.8 cm, 2/6 lymph nodes positive T1cN1 stage II a, ER/PR positive HER-2 negative Ki-67 3%; left mastectomy: ALH; Mammaprint low risk, luminal type A (did not need chemotherapy) Adjuvant radiation therapy from 12/09/2015 to 01/27/2016   Recommendation: Adjuvant antiestrogen therapy with anastrozole 1 mg by mouth daily (will evaluate her for clinical trial [facility] P[facility]AS clinical  trial counseling: Patients who have completed definitive therapy for breast cancer are randomized to antiestrogen therapy (5+ years) versus antiestrogen therapy plus Palbociclib (2 years). Palbociclib: If she was randomized to Palbociclib, I discussed the risks and benefits of Ibrance including myelosuppression especially neutropenia and with that risk of infection, there is risk of pulmonary embolism and mild peripheral neuropathy as well. Fatigue, nausea, diarrhea, decreased appetite as well as alopecia and thrombocytopenia are also potential side effects of Palbociclib.  I would like to see her back in 3 months to see how well she is tolerating anastrozole before recommending the clinical trial.  No orders of the defined types were placed in this encounter.   The patient has a good understanding of the overall plan. she agrees with it. she will call with any problems that  may develop before the next visit here.   Rulon Eisenmenger, MD 02/02/2016

## 2016-02-02 NOTE — Assessment & Plan Note (Signed)
Bilateral mastectomies 10/27/2015: Right mastectomy: IDC grade 1, 2 foci, largest measures 1.8 cm, 2/6 lymph nodes positive T1cN1 stage II a, ER/PR positive HER-2 negative Ki-67 3%; left mastectomy: ALH; Mammaprint low risk, luminal type A (did not need chemotherapy) Adjuvant radiation therapy from 12/09/2015 to 01/27/2016   Recommendation: Adjuvant antiestrogen therapy with anastrozole 1 mg by mouth daily (will evaluate her for clinical trial PALLAS PALLAS clinical trial counseling: Patients who have completed definitive therapy for breast cancer are randomized to antiestrogen therapy (5+ years) versus antiestrogen therapy plus Palbociclib (2 years). Palbociclib: If she was randomized to Palbociclib, I discussed the risks and benefits of Ibrance including myelosuppression especially neutropenia and with that risk of infection, there is risk of pulmonary embolism and mild peripheral neuropathy as well. Fatigue, nausea, diarrhea, decreased appetite as well as alopecia and thrombocytopenia are also potential side effects of Palbociclib.  Return to clinic based upon clinical trial participation.

## 2016-02-02 NOTE — Telephone Encounter (Signed)
appt made and avs printed °

## 2016-02-08 ENCOUNTER — Other Ambulatory Visit: Payer: Self-pay | Admitting: *Deleted

## 2016-02-08 DIAGNOSIS — C50411 Malignant neoplasm of upper-outer quadrant of right female breast: Secondary | ICD-10-CM

## 2016-02-09 ENCOUNTER — Telehealth: Payer: Self-pay | Admitting: Hematology and Oncology

## 2016-02-09 NOTE — Telephone Encounter (Signed)
appt made per pof. Calendar sent to patient by mail

## 2016-02-11 NOTE — Progress Notes (Signed)
  Radiation Oncology         [phone] ________________________________  Name: Chelsea Patterson MRN: [id number]  Date: 01/27/2016  DOB: 1956-05-20  End of Treatment Note  ICD-9-CM ICD-10-CM     1. Breast cancer of upper-outer quadrant of right female breast ([place]) 174.4 C50.411     DIAGNOSIS:Stage II (mpT1c, pN1a) invasive ductal carcinoma Of the right breast     Indication for treatment:  Curative       Radiation treatment dates:   12/09/2015-01/27/2016  Site/dose:   1.) Right chest wall, 50.4 Gy at 28 fractions 2.) Right chest/mastectomy scar, 10 Gy at 5 fractions   Beams/energy:   1.) 3D, 10X, 6X 2.) En Face, 6 MeV   Narrative: The patient tolerated radiation treatment relatively well.  Some itching and discomfort along the chest wall and some fatigue. No moist desquamation  Plan: The patient has completed radiation treatment. The patient will return to radiation oncology clinic for routine followup in one month. I advised them to call or return sooner if they have any questions or concerns related to their recovery or treatment.  -----------------------------------  Blair Promise, PhD, MD  This document serves as a record of services personally performed by Gery Pray, MD. It was created on his behalf by Truddie Hidden, a trained medical scribe. The creation of this record is based on the scribe's personal observations and the provider's statements to them. This document has been checked and approved by the attending provider.

## 2016-02-29 ENCOUNTER — Encounter: Payer: Self-pay | Admitting: Oncology

## 2016-03-03 ENCOUNTER — Ambulatory Visit
Admission: RE | Admit: 2016-03-03 | Discharge: 2016-03-03 | Disposition: A | Discharge status: Home / Self Care | Payer: 59 | Source: Ambulatory Visit | Attending: Radiation Oncology | Admitting: Radiation Oncology

## 2016-03-03 VITALS — BP 115/74 | HR 74 | Temp 97.7°F | Resp 10 | Wt 142.2 lb

## 2016-03-03 DIAGNOSIS — C50911 Malignant neoplasm of unspecified site of right female breast: Secondary | ICD-10-CM | POA: Diagnosis not present

## 2016-03-03 DIAGNOSIS — Z923 Personal history of irradiation: Secondary | ICD-10-CM | POA: Insufficient documentation

## 2016-03-03 DIAGNOSIS — Z9013 Acquired absence of bilateral breasts and nipples: Secondary | ICD-10-CM | POA: Insufficient documentation

## 2016-03-03 DIAGNOSIS — Z91040 Latex allergy status: Secondary | ICD-10-CM | POA: Diagnosis not present

## 2016-03-03 DIAGNOSIS — Z170 Estrogen receptor positive status [ER+]: Secondary | ICD-10-CM | POA: Insufficient documentation

## 2016-03-03 DIAGNOSIS — Z888 Allergy status to other drugs, medicaments and biological substances status: Secondary | ICD-10-CM | POA: Insufficient documentation

## 2016-03-03 DIAGNOSIS — Z79811 Long term (current) use of aromatase inhibitors: Secondary | ICD-10-CM | POA: Diagnosis not present

## 2016-03-03 DIAGNOSIS — C50411 Malignant neoplasm of upper-outer quadrant of right female breast: Secondary | ICD-10-CM

## 2016-03-03 NOTE — Progress Notes (Signed)
Radiation Oncology         [phone] ________________________________  Name: Chelsea Patterson MRN: [id number]  Date: 03/03/2016  DOB: 19-Mar-1956  Follow-Up Visit Note  CC: Shirline Frees, MD  Nicholas Lose, MD    ICD-9-CM ICD-10-CM   1. Breast cancer of upper-outer quadrant of right female breast ([place]) 174.4 C50.411     Diagnosis: Stage IIa (mpT1c, pN1a) invasive ductal carcinoma of the right breast (ER/PR positive HER-2 negative)  Interval Since Last Radiation: 1 month  12/09/2015-01/27/2016: 1.) Right chest wall, 50.4 Gy at 28 fractions 2.) Right chest/mastectomy scar, 10 Gy at 5 fractions   Narrative:  The patient returns today for routine follow-up. The patient is taking Arimidex and reports the first few days of taking it she felt "sick like a dog," but is better now. She is currently in no pain. She complains of fatigue, loss of sleep, and poor appetite-typical for her. Hot flashes at night which are affecting her sleep. She denies bilateral arm edema. She doesn't wear her arm sleeve. She reports the area under right axilla is "bothersome." The patient continues to apply lotion with Vitamin E as directed to the treatment area.  ALLERGIES:  is allergic to wellbutrin [bupropion]; latex; and prozac [fluoxetine hcl].  Meds: Current Outpatient Prescriptions  Medication Sig Dispense Refill  . acetaminophen (TYLENOL ARTHRITIS PAIN) 650 MG CR tablet Take 2,600 mg by mouth every 8 (eight) hours as needed for pain. Reported on 12/22/2015    . anastrozole (ARIMIDEX) 1 MG tablet Take 1 tablet (1 mg total) by mouth daily. 90 tablet 3  . atorvastatin (LIPITOR) 40 MG tablet Take 40 mg by mouth daily at 6 PM.     . b complex vitamins tablet Take 1 tablet by mouth daily.    . cholecalciferol (VITAMIN D) 1000 UNITS tablet Take 4,000 Units by mouth daily. Reported on 01/26/2016    . Collagen-Vitamin C (COLLAGEN PLUS VITAMIN C PO) Take 1 tablet by mouth daily. Reported on 12/22/2015    .  escitalopram (LEXAPRO) 20 MG tablet Take 20 mg by mouth daily.     . fish oil-omega-3 fatty acids 1000 MG capsule Take 2 g by mouth daily.    Marland Kitchen ibuprofen (ADVIL,MOTRIN) 200 MG tablet Take 800 mg by mouth every 8 (eight) hours as needed.    . Multiple Vitamin (MULTIVITAMIN) tablet Take 1 tablet by mouth daily.    . non-metallic deodorant Jethro Poling) MISC Apply 1 application topically daily as needed.    Marland Kitchen omeprazole (PRILOSEC) 40 MG capsule Take 40 mg by mouth daily.    Vladimir Faster Glycol-Propyl Glycol (SYSTANE OP) Place 1-2 drops into both eyes as needed (for dry eyes).    . Probiotic Product (PROBIOTIC PO) Take 1 capsule by mouth daily.     Marland Kitchen tiotropium (SPIRIVA) 18 MCG inhalation capsule Place 18 mcg into inhaler and inhale daily.    . valACYclovir (VALTREX) 500 MG tablet Take 500 mg by mouth daily.     . Wound Cleansers (RADIAPLEX EX) Apply topically. Reported on 01/05/2016    . Wound Dressings (SONAFINE EX) Apply topically. Reported on 01/26/2016    . XOPENEX HFA 45 MCG/ACT inhaler Inhale 2 puffs into the lungs every 4 (four) hours as needed for wheezing or shortness of breath.      No current facility-administered medications for this encounter.     Physical Findings: The patient is in no acute distress. Patient is alert and oriented.  weight is 142 lb 3.2 oz (  64.5 kg). Her oral temperature is 97.7 F (36.5 C). Her blood pressure is 115/74 and her pulse is 74. Her respiration is 10 and oxygen saturation is 99%.   Lungs are clear to auscultation bilaterally. Heart has regular rate and rhythm. No palpable cervical, supraclavicular, or axillary adenopathy. The left chest wall shows a mastectomy scar without palpable or visible signs of recurrence. The right chest wall area also shows a mastectomy scar without palpable or visible signs of recurrence. She has some hyperpigmentation changes along the right side.  Lab Findings: Lab Results  Component Value Date   WBC 14.2 (H) 10/27/2015   HGB 11.2  (L) 10/27/2015   HCT 33.7 (L) 10/27/2015   MCV 96.3 10/27/2015   PLT 249 10/27/2015    Radiographic Findings: No results found.  Impression:  Stage IIa (mpT1c, pN1a) invasive ductal carcinoma of the right breast (ER/PR positive HER-2 negative)  No evidence of recurrence on clinical exam.  Plan: The patient is scheduled for Survivorship on 04/13/16 and will follow up with Dr. Lindi Adie on 05/04/16. Routine follow up in radiation oncology in 6 months.  ____________________________________ -----------------------------------  Blair Promise, PhD, MD  This document serves as a record of services personally performed by Gery Pray, MD. It was created on his behalf by Darcus Austin, a trained medical scribe. The creation of this record is based on the scribe's personal observations and the provider's statements to them. This document has been checked and approved by the attending provider.

## 2016-03-03 NOTE — Progress Notes (Signed)
She is currently in no pain. Pt complains of fatigue, loss of sleep and poor appetite-typical for her.  Hot flashes at night which is affecting her sleep Pt right breast- positive for Hyperpigmentation.  Pt denies edema. She doesn't wear her arm sleeve.  Area under right axilla she reports is "bothersome." Pt continues to apply lotion with Vitamin E as directed. BP 115/74   Pulse 74   Temp 97.7 F (36.5 C) (Oral)   Resp 10   Wt 142 lb 3.2 oz (64.5 kg)   LMP 09/15/2009 (Approximate)   SpO2 99%   BMI 22.27 kg/m  Wt Readings from Last 3 Encounters:  03/03/16 142 lb 3.2 oz (64.5 kg)  02/02/16 143 lb 6.4 oz (65 kg)  01/26/16 144 lb 8 oz (65.5 kg)   Pt reports: Yes No Comments  Nolvadex (tamoxifen) []  [x]    Arimidex (anastrozole) [x]  []    Last Med Onc Appt: Date:  Next:10/08 Doctor: Lindi Adie  Last Mammogram: Date:07/2015 Next:   Survivorship Appt: Date:09/27 [] [facility] pamphlet []  [employer]

## 2016-04-13 ENCOUNTER — Ambulatory Visit (HOSPITAL_BASED_OUTPATIENT_CLINIC_OR_DEPARTMENT_OTHER): Payer: 59

## 2016-04-13 ENCOUNTER — Ambulatory Visit (HOSPITAL_BASED_OUTPATIENT_CLINIC_OR_DEPARTMENT_OTHER): Payer: 59 | Admitting: Adult Health

## 2016-04-13 ENCOUNTER — Encounter: Payer: Self-pay | Admitting: Adult Health

## 2016-04-13 VITALS — BP 100/62 | HR 81 | Temp 98.3°F | Resp 18 | Wt 143.6 lb

## 2016-04-13 DIAGNOSIS — R5383 Other fatigue: Secondary | ICD-10-CM

## 2016-04-13 DIAGNOSIS — K5900 Constipation, unspecified: Secondary | ICD-10-CM

## 2016-04-13 DIAGNOSIS — R232 Flushing: Secondary | ICD-10-CM

## 2016-04-13 DIAGNOSIS — C50411 Malignant neoplasm of upper-outer quadrant of right female breast: Secondary | ICD-10-CM

## 2016-04-13 DIAGNOSIS — Z720 Tobacco use: Secondary | ICD-10-CM

## 2016-04-13 DIAGNOSIS — N951 Menopausal and female climacteric states: Secondary | ICD-10-CM | POA: Diagnosis not present

## 2016-04-13 DIAGNOSIS — Z79811 Long term (current) use of aromatase inhibitors: Secondary | ICD-10-CM

## 2016-04-13 DIAGNOSIS — T451X5A Adverse effect of antineoplastic and immunosuppressive drugs, initial encounter: Principal | ICD-10-CM

## 2016-04-13 DIAGNOSIS — Z170 Estrogen receptor positive status [ER+]: Secondary | ICD-10-CM

## 2016-04-13 LAB — TSH: TSH: 0.823 m[IU]/L (ref 0.308–3.960)

## 2016-04-13 MED ORDER — GABAPENTIN 100 MG PO CAPS: 100.0000 mg | ORAL_CAPSULE | Freq: Every day | ORAL | 2 refills | Status: DC
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
## 2016-04-13 NOTE — Addendum Note (Signed)
Encounter addended by: Jacqulyn Liner, RN on: 04/13/2016  3:55 PM<BR>    Actions taken: Charge Capture section accepted

## 2016-04-14 ENCOUNTER — Telehealth: Payer: Self-pay | Admitting: *Deleted

## 2016-04-14 NOTE — Telephone Encounter (Signed)
Called pt to inform her of results concerning TSH level. No answer , but left detailed message to pt's VM letting her know levels were normal. I advised pt to keep exercising to try to gain some more energy and I also mentioned to keep taking Gabapentin to help her sleep at night. If pt has any questions, she knows to call this nurse at [phone]. Message fwd to G. Renato Battles, NP.

## 2016-05-04 ENCOUNTER — Encounter: Payer: Self-pay | Admitting: Hematology and Oncology

## 2016-05-04 ENCOUNTER — Ambulatory Visit (HOSPITAL_BASED_OUTPATIENT_CLINIC_OR_DEPARTMENT_OTHER): Payer: 59 | Admitting: Hematology and Oncology

## 2016-05-04 ENCOUNTER — Encounter: Payer: Self-pay | Admitting: *Deleted

## 2016-05-04 DIAGNOSIS — N951 Menopausal and female climacteric states: Secondary | ICD-10-CM | POA: Diagnosis not present

## 2016-05-04 DIAGNOSIS — R52 Pain, unspecified: Secondary | ICD-10-CM | POA: Diagnosis not present

## 2016-05-04 DIAGNOSIS — Z79811 Long term (current) use of aromatase inhibitors: Secondary | ICD-10-CM

## 2016-05-04 DIAGNOSIS — M791 Myalgia: Secondary | ICD-10-CM | POA: Diagnosis not present

## 2016-05-04 DIAGNOSIS — C50411 Malignant neoplasm of upper-outer quadrant of right female breast: Secondary | ICD-10-CM

## 2016-05-04 DIAGNOSIS — Z170 Estrogen receptor positive status [ER+]: Secondary | ICD-10-CM

## 2016-05-04 MED ORDER — LETROZOLE 2.5 MG PO TABS: 2.5000 mg | ORAL_TABLET | Freq: Every day | ORAL | 0 refills | Status: DC

## 2016-05-04 NOTE — Assessment & Plan Note (Addendum)
Bilateral mastectomies 10/27/2015: Right mastectomy: IDC grade 1, 2 foci, largest measures 1.8 cm, 2/6 lymph nodes positive T1cN1 stage II a, ER/PR positive HER-2 negative Ki-67 3%; left mastectomy: ALH; Mammaprint low risk, luminal type A (did not need chemotherapy) Adjuvant radiation therapy from 12/09/2015 to 01/27/2016   Recommendation: Adjuvant antiestrogen therapy with anastrozole 1 mg by mouth daily (will evaluate her for clinical trial [facility] clinical trial counseling: Patients who have completed definitive therapy for breast cancer are randomized to antiestrogen therapy (5+ years) versus antiestrogen therapy plus Palbociclib (2 years).  Anastrozole toxicities:  Return to clinic in 6 months for follow-up

## 2016-05-04 NOTE — Progress Notes (Signed)
Patient Care Team: Shirline Frees, MD as PCP - General (Family Medicine) Kem Boroughs, FNP as Nurse Practitioner (Family Medicine) Sylvan Cheese, NP as Nurse Practitioner (Hematology and Oncology)  DIAGNOSIS:  Encounter Diagnosis  Name Primary?  . Malignant neoplasm of upper-outer quadrant of right breast in female, estrogen receptor positive ([facility])     SUMMARY OF ONCOLOGIC HISTORY:   Breast cancer of upper-outer quadrant of right female breast ([facility])   09/04/2015 Initial Diagnosis    Right breast biopsy invasive ductal carcinoma with papillary features, grade 1, ER/PR positive HER-2 negative Ki-67 3%; 6 month follow-up mamm: stable left breast calcifications but new right breast calcifications 2 sets 2 mm size (only one set biopsied)      10/27/2015 Surgery    Bilateral mastectomies [name]), right mastectomy: IDC grade 1, 2 foci, largest measures 1.8 cm, 2/6 lymph nodes positive T1cN1 stage II a, ER/PR positive HER-2 negative Ki-67 3%; left mastectomy: ALH.      10/27/2015 Oncotype testing    Mammaprint: Low risk, Luminal-type       12/09/2015 - 01/27/2016 Radiation Therapy    Adjuvant radiation therapy ([name]). Right chest wall, 50.4 Gy at 28 fractions.  Right chest/mastectomy scar boost, 10 Gy at 5 fractions       02/2016 -  Anti-estrogen oral therapy    Anastrozole 1 mg daily; switched to letrozole 2.5 mg daily 05/04/2016 due to myalgias and arthralgias       CHIEF COMPLIANT: Follow-up on anastrozole therapy  INTERVAL HISTORY: Chelsea Patterson is a 60 year old with above-mentioned history of right breast cancer treated with bilateral mastectomies currently on adjuvant antiestrogen therapy after radiation is complete. She has tolerated anastrozole moderately well. She is complaining of diffuse muscle aches and pains. These are especially worse at the end of the day. This is making it difficult to sleep at night. It has not improved over the past couple of  months.  REVIEW OF SYSTEMS:   Constitutional: Denies fevers, chills or abnormal weight loss Eyes: Denies blurriness of vision Ears, nose, mouth, throat, and face: Denies mucositis or sore throat Respiratory: Denies cough, dyspnea or wheezes Cardiovascular: Denies palpitation, chest discomfort Gastrointestinal:  Denies nausea, heartburn or change in bowel habits Skin: Denies abnormal skin rashes Lymphatics: Denies new lymphadenopathy or easy bruising Neurological:Denies numbness, tingling or new weaknesses Behavioral/Psych: Mood is stable, no new changes  Extremities: No lower extremity edema, diffuse muscular skeletal aches and pains Breast:  denies any pain or lumps or nodules in either breasts All other systems were reviewed with the patient and are negative.  I have reviewed the past medical history, past surgical history, social history and family history with the patient and they are unchanged from previous note.  ALLERGIES:  is allergic to wellbutrin [bupropion]; latex; and prozac [fluoxetine hcl].  MEDICATIONS:  Current Outpatient Prescriptions  Medication Sig Dispense Refill  . acetaminophen (TYLENOL ARTHRITIS PAIN) 650 MG CR tablet Take 2,600 mg by mouth every 8 (eight) hours as needed for pain. Reported on 12/22/2015    . atorvastatin (LIPITOR) 40 MG tablet Take 40 mg by mouth daily at 6 PM.     . b complex vitamins tablet Take 1 tablet by mouth daily.    . cholecalciferol (VITAMIN D) 1000 UNITS tablet Take 4,000 Units by mouth daily. Reported on 01/26/2016    . Collagen-Vitamin C (COLLAGEN PLUS VITAMIN C PO) Take 1 tablet by mouth daily. Reported on 12/22/2015    . escitalopram (LEXAPRO) 20 MG tablet Take  20 mg by mouth daily.     . fish oil-omega-3 fatty acids 1000 MG capsule Take 2 g by mouth daily.    Marland Kitchen gabapentin (NEURONTIN) 100 MG capsule Take 1 capsule (100 mg total) by mouth at bedtime. 90 capsule 2  . ibuprofen (ADVIL,MOTRIN) 200 MG tablet Take 800 mg by mouth every 8  (eight) hours as needed.    Marland Kitchen letrozole (FEMARA) 2.5 MG tablet Take 1 tablet (2.5 mg total) by mouth daily. 30 tablet 0  . Multiple Vitamin (MULTIVITAMIN) tablet Take 1 tablet by mouth daily.    Marland Kitchen omeprazole (PRILOSEC) 40 MG capsule Take 40 mg by mouth daily.    Vladimir Faster Glycol-Propyl Glycol (SYSTANE OP) Place 1-2 drops into both eyes as needed (for dry eyes).    . Probiotic Product (PROBIOTIC PO) Take 1 capsule by mouth daily.     Marland Kitchen tiotropium (SPIRIVA) 18 MCG inhalation capsule Place 18 mcg into inhaler and inhale daily.    . valACYclovir (VALTREX) 500 MG tablet Take 500 mg by mouth daily.     Penne Lash HFA 45 MCG/ACT inhaler Inhale 2 puffs into the lungs every 4 (four) hours as needed for wheezing or shortness of breath.      No current facility-administered medications for this visit.     PHYSICAL EXAMINATION: ECOG PERFORMANCE STATUS: 1 - Symptomatic but completely ambulatory  Vitals:   05/04/16 0925  BP: 100/60  Pulse: 87  Resp: 18  Temp: 98.4 F (36.9 C)   Filed Weights   05/04/16 0925  Weight: 144 lb 1.6 oz (65.4 kg)    GENERAL:alert, no distress and comfortable SKIN: skin color, texture, turgor are normal, no rashes or significant lesions EYES: normal, Conjunctiva are pink and non-injected, sclera clear OROPHARYNX:no exudate, no erythema and lips, buccal mucosa, and tongue normal  NECK: supple, thyroid normal size, non-tender, without nodularity LYMPH:  no palpable lymphadenopathy in the cervical, axillary or inguinal LUNGS: clear to auscultation and percussion with normal breathing effort HEART: regular rate & rhythm and no murmurs and no lower extremity edema ABDOMEN:abdomen soft, non-tender and normal bowel sounds MUSCULOSKELETAL:no cyanosis of digits and no clubbing  NEURO: alert & oriented x 3 with fluent speech, no focal motor/sensory deficits EXTREMITIES: No lower extremity edema  LABORATORY DATA:  I have reviewed the data as listed   Chemistry       Component Value Date/Time   NA 144 10/27/2015 0621   NA 142 09/09/2015 1209   K 4.6 10/27/2015 0621   K 4.1 09/09/2015 1209   CL 109 10/27/2015 0621   CO2 24 10/27/2015 0621   CO2 29 09/09/2015 1209   BUN 11 10/27/2015 0621   BUN 10.2 09/09/2015 1209   CREATININE 0.78 10/27/2015 1626   CREATININE 0.9 09/09/2015 1209      Component Value Date/Time   CALCIUM 9.4 10/27/2015 0621   CALCIUM 9.4 09/09/2015 1209   ALKPHOS 54 10/27/2015 0621   ALKPHOS 54 09/09/2015 1209   AST 23 10/27/2015 0621   AST 17 09/09/2015 1209   ALT 16 10/27/2015 0621   ALT 11 09/09/2015 1209   BILITOT 0.5 10/27/2015 0621   BILITOT 0.45 09/09/2015 1209       Lab Results  Component Value Date   WBC 14.2 (H) 10/27/2015   HGB 11.2 (L) 10/27/2015   HCT 33.7 (L) 10/27/2015   MCV 96.3 10/27/2015   PLT 249 10/27/2015   NEUTROABS 4.2 10/27/2015     ASSESSMENT & PLAN:  Breast cancer  of upper-outer quadrant of right female breast [facility]) Bilateral mastectomies 10/27/2015: Right mastectomy: IDC grade 1, 2 foci, largest measures 1.8 cm, 2/6 lymph nodes positive T1cN1 stage II a, ER/PR positive HER-2 negative Ki-67 3%; left mastectomy: ALH; Mammaprint low risk, luminal type A (did not need chemotherapy) Adjuvant radiation therapy from 12/09/2015 to 01/27/2016   Recommendation: Adjuvant antiestrogen therapy with anastrozole 1 mg by mouth daily (will evaluate her for clinical trial PALLAS PALLAS clinical trial counseling: Patients who have completed definitive therapy for breast cancer are randomized to antiestrogen therapy (5+ years) versus antiestrogen therapy plus Palbociclib (2 years).  Anastrozole toxicities: 1. Musculoskeletal aches and pains 2. hot flashes which improved with Neurontin 3. Difficulty sleeping also improved with Neurontin  After lengthy discussion about the choice of therapy, we elected to switch her from anastrozole to letrozole. I sent a 30 day prescription for letrozole. She tolerates  it well then we can continue with a 90 days supply.  I also counseled her regarding ABC clinical trial which randomizes patient began aspirin versus placebo. She will receive the information and will make a decision whether or not she wants to participate in this trial. We counseled her previously regarding PALLAS clinical trial but the trial closed for stage II a patients.   Return to clinic in 3 months for follow-up   No orders of the defined types were placed in this encounter.  The patient has a good understanding of the overall plan. she agrees with it. she will call with any problems that may develop before the next visit here.   Rulon Eisenmenger, MD 05/04/16

## 2016-06-01 ENCOUNTER — Other Ambulatory Visit: Payer: Self-pay | Admitting: Hematology and Oncology

## 2016-06-08 IMAGING — CT CT ABD-PELV W/ CM
2 of 5 series · 15 of 46 positions shown, 17 images · IV contrast (iopamidol)
Comparison: 06/20/2014

CLINICAL DATA: Staging right breast cancer. Bilateral mastectomies.
Radiation therapy to start in November 2015.

EXAM:
CT CHEST, ABDOMEN, AND PELVIS WITH CONTRAST
TECHNIQUE: Multidetector CT imaging of the chest, abdomen and pelvis was
performed following the standard protocol during bolus
administration of intravenous contrast.
CONTRAST:  100mL C0NGYW-HAA IOPAMIDOL (C0NGYW-HAA) INJECTION 61%

[Series 2: cap with st · axial · 0.73mm/px · z∈[-714,-149]mm · 12 of 135 slices shown, 14 images]
[im 11/135  soft-tissue]
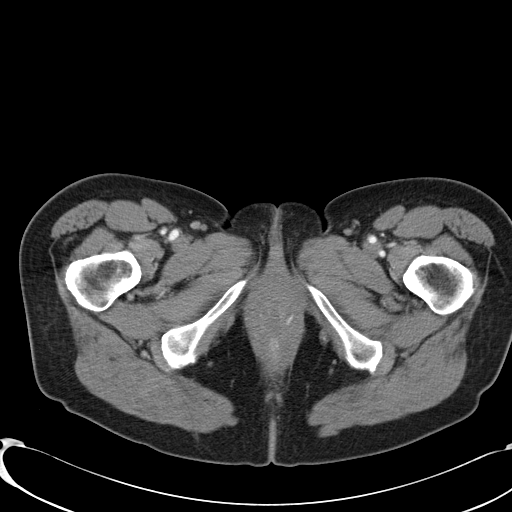
[im 11/135  bone]
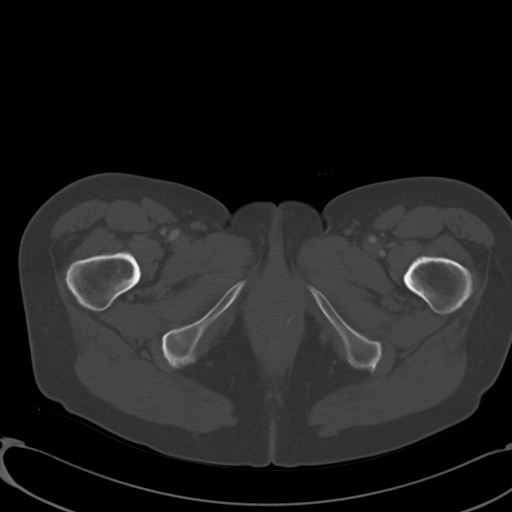
[im 21/135  soft-tissue]
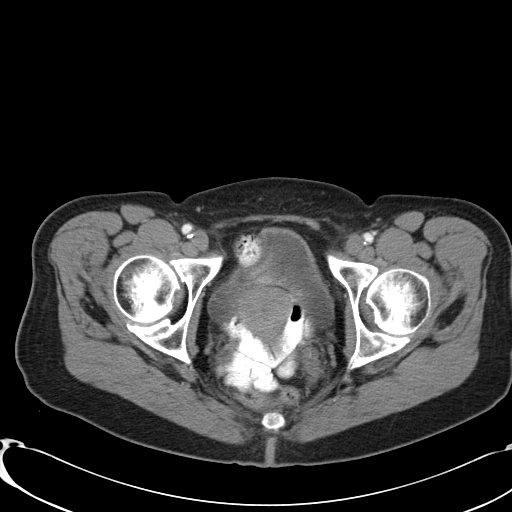
[im 31/135  soft-tissue]
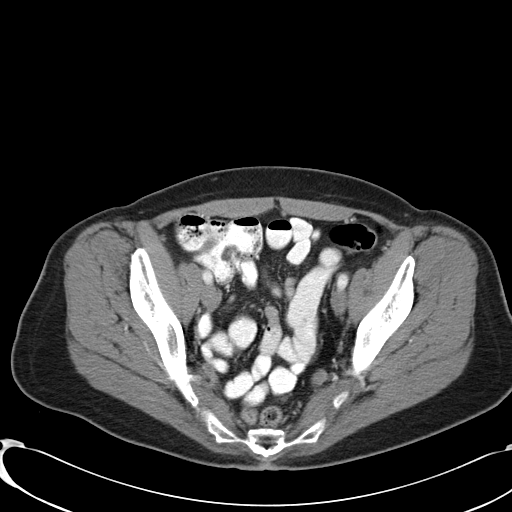
[im 42/135  soft-tissue]
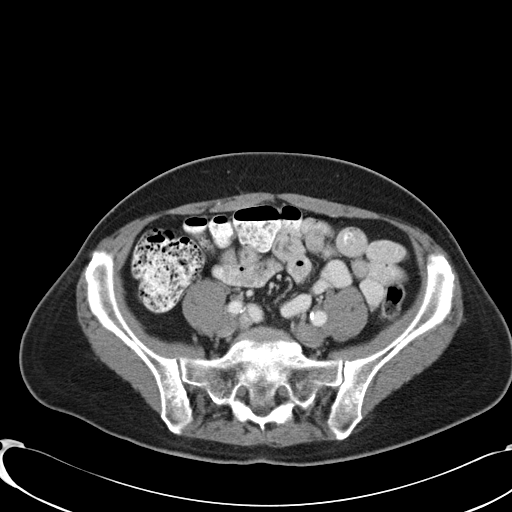
[im 52/135  soft-tissue]
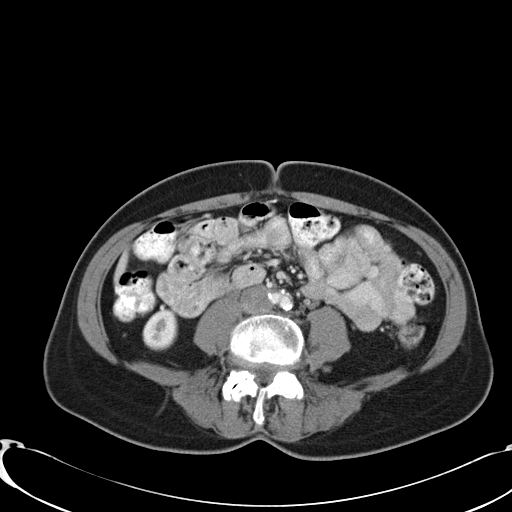
[im 62/135  soft-tissue]
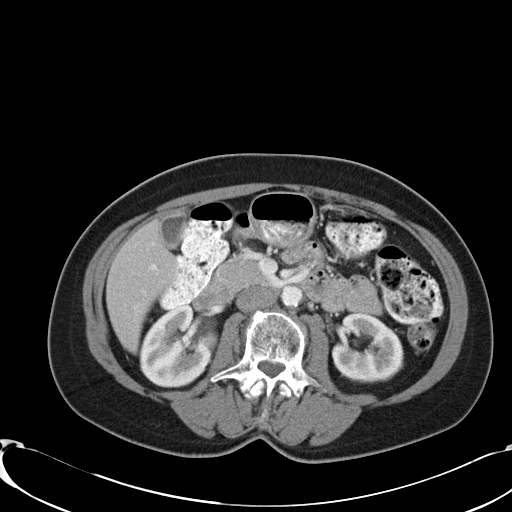
[im 73/135  soft-tissue]
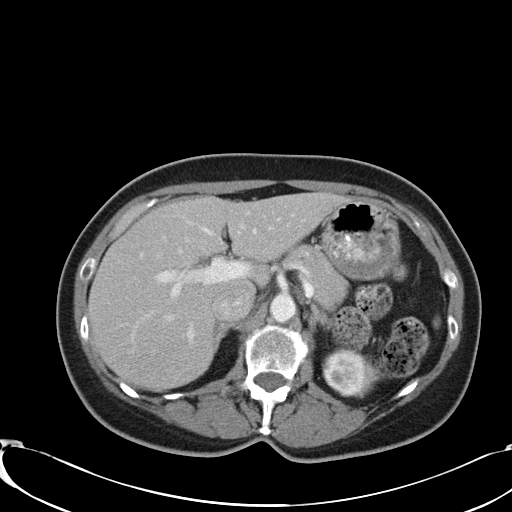
[im 83/135  soft-tissue]
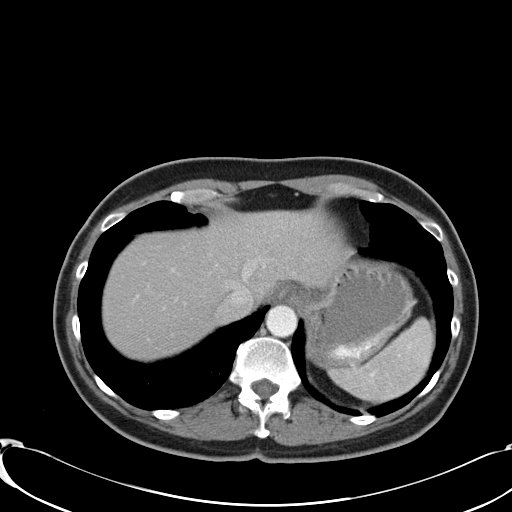
[im 93/135  soft-tissue]
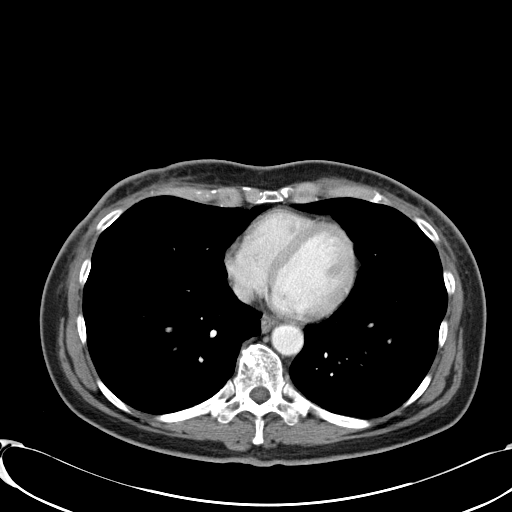
[im 93/135  bone]
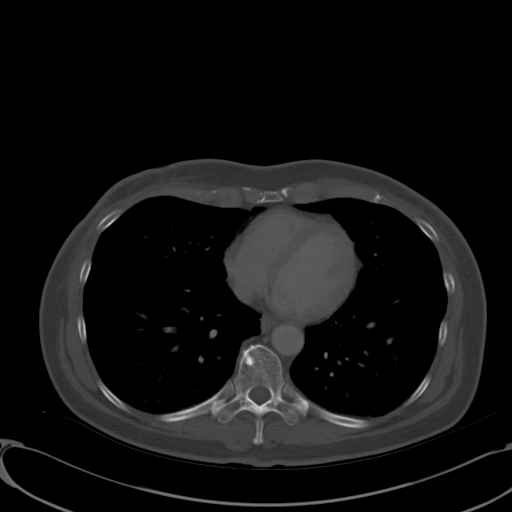
[im 104/135  soft-tissue]
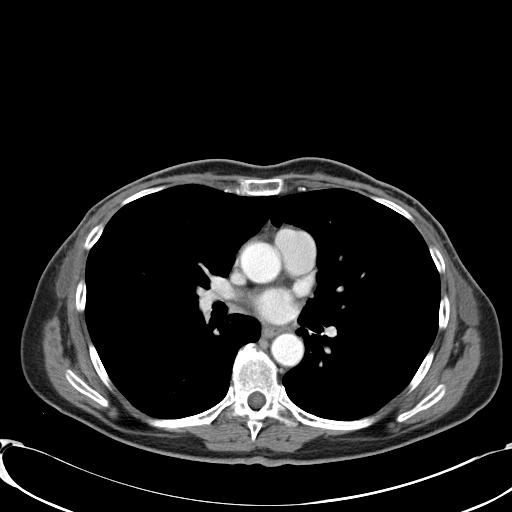
[im 114/135  soft-tissue]
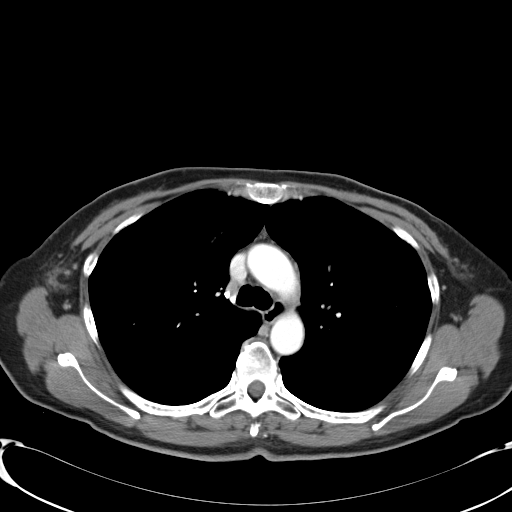
[im 124/135  soft-tissue]
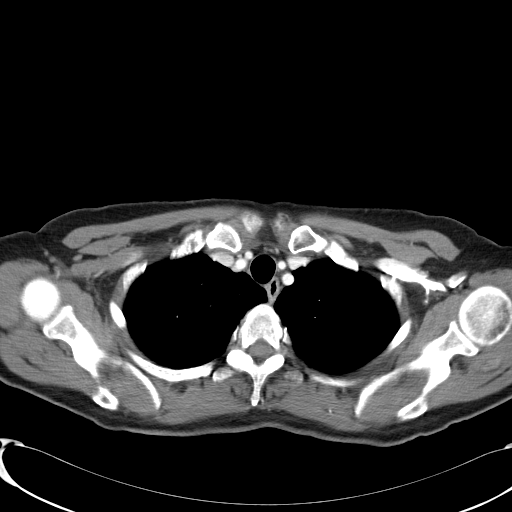

[Series 604: <mpr thick range(2)> · coronal · 1.32mm/px · 3 of 136 slices shown]
[im 46/136  soft-tissue]
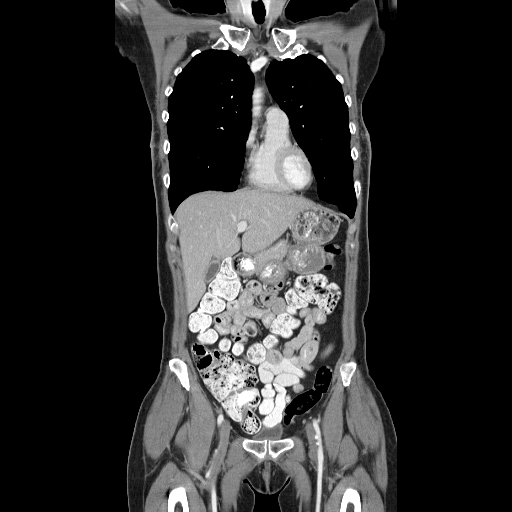
[im 61/136  soft-tissue]
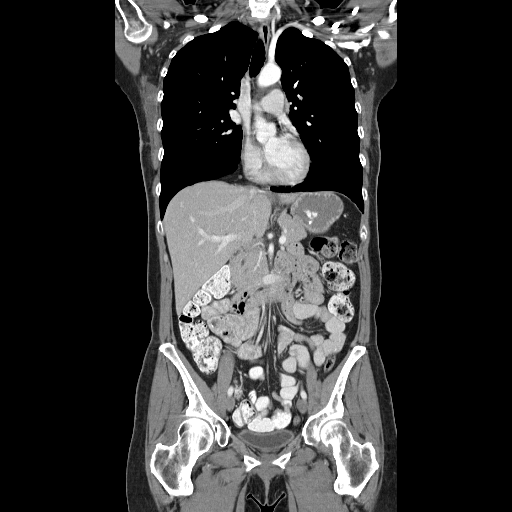
[im 76/136  soft-tissue]
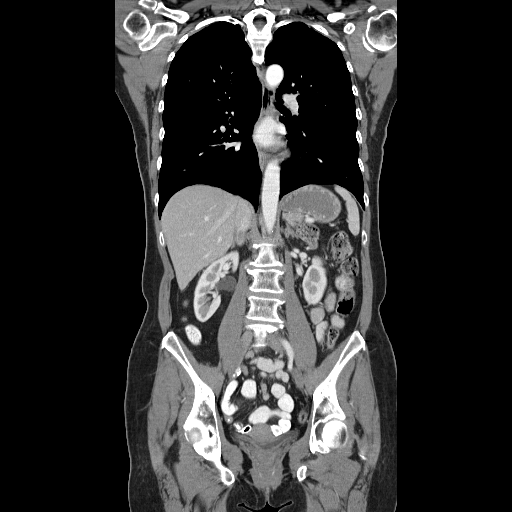

[15 of 46 positions shown; findings below may reference images not displayed]

FINDINGS: CT CHEST FINDINGS

Mediastinum/Nodes: No pathologic mediastinal or hilar adenopathy.
Triangular-shaped left axillary fluid collection approximately
by 3.9 cm on image [DATE], likely a benign postoperative fluid
collection. Similar but slightly more irregular appearance on the
right with some slight lobularity along the upper margin of the
fluid collection, this merit surveillance but I do not see any
definite enlarged lymph nodes in the region. No internal mammary
adenopathy identified. No significant vascular abnormality
identified.

Lungs/Pleura: Mild biapical pleural parenchymal scarring.
Centrilobular emphysema. Scarring or subsegmental atelectasis in the
posterior basal segments of both lower lobes. No compelling findings
of metastatic disease to the lungs.

Musculoskeletal: Thoracic spondylosis. Slight chronic appearing
anterior wedging at T6.

CT ABDOMEN PELVIS FINDINGS

Hepatobiliary: Mildly contracted gallbladder.

Pancreas: Unremarkable

Spleen: Unremarkable

Adrenals/Urinary Tract: 1.4 by 1.1 cm right adrenal mass, relative
washout 72%, most compatible with adenoma.

1-2 mm left mid kidney nonobstructive calculus, image 73/2. Several
tiny left renal hypodense lesions are technically too small to
characterize although statistically likely cysts.

Stomach/Bowel: Unremarkable

Vascular/Lymphatic: Aortoiliac atherosclerotic vascular disease. No
pathologic adenopathy identified.

Reproductive: Unremarkable

Other: No supplemental non-categorized findings.

Musculoskeletal: Lumbar spondylosis and degenerative disc disease
causing left greater than right impingement at L3-4, L4-5, and
L5-S1. No findings of osseous metastatic disease.
IMPRESSION: 1. No findings of residual or metastatic breast cancer.
Postoperative fluid collections along the axilla merit observation.
2. Centrilobular emphysema.
3. 1-2 mm left mid kidney nonobstructive calculus.
4. Lumbar spondylosis and degenerative disc disease causing
impingement in the lower lumbar spine.
5. Right adrenal adenoma.
6.  Aortoiliac atherosclerotic vascular disease.

## 2016-07-03 ENCOUNTER — Other Ambulatory Visit: Payer: Self-pay | Admitting: Hematology and Oncology

## 2016-07-25 ENCOUNTER — Other Ambulatory Visit: Payer: Self-pay | Admitting: *Deleted

## 2016-07-25 DIAGNOSIS — E7800 Pure hypercholesterolemia, unspecified: Secondary | ICD-10-CM | POA: Diagnosis not present

## 2016-07-25 DIAGNOSIS — J449 Chronic obstructive pulmonary disease, unspecified: Secondary | ICD-10-CM | POA: Diagnosis not present

## 2016-07-25 DIAGNOSIS — Z170 Estrogen receptor positive status [ER+]: Principal | ICD-10-CM

## 2016-07-25 DIAGNOSIS — C50411 Malignant neoplasm of upper-outer quadrant of right female breast: Secondary | ICD-10-CM

## 2016-08-03 ENCOUNTER — Other Ambulatory Visit: Payer: 59

## 2016-08-03 ENCOUNTER — Ambulatory Visit: Payer: 59 | Admitting: Hematology and Oncology

## 2016-08-03 ENCOUNTER — Encounter: Payer: 59 | Admitting: *Deleted

## 2016-08-11 NOTE — Assessment & Plan Note (Signed)
Bilateral mastectomies 10/27/2015: Right mastectomy: IDC grade 1, 2 foci, largest measures 1.8 cm, 2/6 lymph nodes positive T1cN1 stage II a, ER/PR positive HER-2 negative Ki-67 3%; left mastectomy: ALH; Mammaprint low risk, luminal type A (did not need chemotherapy) Adjuvant radiation therapy from 12/09/2015 to 01/27/2016   Recommendation: Adjuvant antiestrogen therapy with anastrozole 1 mg by mouth daily switched to Letrozole 05/04/16 due to musculoskeletal Sx  Letrozole toxicities:  Return to clinic in 6 months for follow-up

## 2016-08-12 ENCOUNTER — Ambulatory Visit (HOSPITAL_BASED_OUTPATIENT_CLINIC_OR_DEPARTMENT_OTHER): Payer: 59 | Admitting: Hematology and Oncology

## 2016-08-12 ENCOUNTER — Other Ambulatory Visit: Payer: 59

## 2016-08-12 ENCOUNTER — Encounter: Payer: Self-pay | Admitting: *Deleted

## 2016-08-12 ENCOUNTER — Encounter: Payer: Self-pay | Admitting: Hematology and Oncology

## 2016-08-12 ENCOUNTER — Encounter: Payer: 59 | Admitting: *Deleted

## 2016-08-12 ENCOUNTER — Other Ambulatory Visit: Payer: Self-pay | Admitting: *Deleted

## 2016-08-12 DIAGNOSIS — Z170 Estrogen receptor positive status [ER+]: Secondary | ICD-10-CM | POA: Diagnosis not present

## 2016-08-12 DIAGNOSIS — C50411 Malignant neoplasm of upper-outer quadrant of right female breast: Secondary | ICD-10-CM | POA: Diagnosis not present

## 2016-08-12 DIAGNOSIS — Z9013 Acquired absence of bilateral breasts and nipples: Secondary | ICD-10-CM | POA: Diagnosis not present

## 2016-08-12 DIAGNOSIS — M65842 Other synovitis and tenosynovitis, left hand: Secondary | ICD-10-CM

## 2016-08-12 DIAGNOSIS — Z79811 Long term (current) use of aromatase inhibitors: Secondary | ICD-10-CM

## 2016-08-12 DIAGNOSIS — L853 Xerosis cutis: Secondary | ICD-10-CM

## 2016-08-12 NOTE — Progress Notes (Signed)
 Patient Care Team: William Harris, MD as PCP - General (Family Medicine) Patricia Grubb, FNP as Nurse Practitioner (Family Medicine) Heather Thompson Mackey, NP as Nurse Practitioner (Hematology and Oncology)  DIAGNOSIS:  Encounter Diagnosis  Name Primary?  . Malignant neoplasm of upper-outer quadrant of right breast in female, estrogen receptor positive ([facility])     SUMMARY OF ONCOLOGIC HISTORY:   Breast cancer of upper-outer quadrant of right female breast ([facility])   09/04/2015 Initial Diagnosis    Right breast biopsy invasive ductal carcinoma with papillary features, grade 1, ER/PR positive HER-2 negative Ki-67 3%; 6 month follow-up mamm: stable left breast calcifications but new right breast calcifications 2 sets 2 mm size (only one set biopsied)      10/27/2015 Surgery    Bilateral mastectomies ([facility]), right mastectomy: IDC grade 1, 2 foci, largest measures 1.8 cm, 2/6 lymph nodes positive T1cN1 stage II a, ER/PR positive HER-2 negative Ki-67 3%; left mastectomy: ALH.      10/27/2015 Oncotype testing    Mammaprint: Low risk, Luminal-type       12/09/2015 - 01/27/2016 Radiation Therapy    Adjuvant radiation therapy ([name]). Right chest wall, 50.4 Gy at 28 fractions.  Right chest/mastectomy scar boost, 10 Gy at 5 fractions       02/2016 -  Anti-estrogen oral therapy    Anastrozole 1 mg daily; switched to letrozole 2.5 mg daily 05/04/2016 due to myalgias and arthralgias       CHIEF COMPLIANT: Follow-up on letrozole therapy  INTERVAL HISTORY: Chelsea Patterson is a 61-year-old with above-mentioned history of bilateral mastectomies for right-sided breast cancer. She received adjuvant radiation therapy and is currently on antiestrogen therapy. She could not tolerate anastrozole is currently on letrozole. She reports of the letrozole is much better tolerated for her. Patient complains of hot flashes 2 or 3 times in the day. She is also complaining of dryness of her skin throughout her  body. Tendinitis in the left wrist for which she is going to see an orthopedic doctor. She has been very active with exercises and has lost some weight. She says she bruises occasionally does not have any current bruises  REVIEW OF SYSTEMS:   Constitutional: Denies fevers, chills or abnormal weight loss Eyes: Denies blurriness of vision Ears, nose, mouth, throat, and face: Denies mucositis or sore throat Respiratory: Denies cough, dyspnea or wheezes Cardiovascular: Denies palpitation, chest discomfort Gastrointestinal:  Denies nausea, heartburn or change in bowel habits Skin: Denies abnormal skin rashes Lymphatics: Denies new lymphadenopathy or easy bruising Neurological:Denies numbness, tingling or new weaknesses Behavioral/Psych: Mood is stable, no new changes  Extremities: No lower extremity edema Breast:  denies any pain or lumps or nodules in either breasts All other systems were reviewed with the patient and are negative.  I have reviewed the past medical history, past surgical history, social history and family history with the patient and they are unchanged from previous note.  ALLERGIES:  is allergic to wellbutrin [bupropion]; latex; and prozac [fluoxetine hcl].  MEDICATIONS:  Current Outpatient Prescriptions  Medication Sig Dispense Refill  . acetaminophen (TYLENOL ARTHRITIS PAIN) 650 MG CR tablet Take 2,600 mg by mouth every 8 (eight) hours as needed for pain. Reported on 12/22/2015    . atorvastatin (LIPITOR) 40 MG tablet Take 40 mg by mouth daily at 6 PM.     . b complex vitamins tablet Take 1 tablet by mouth daily.    . cholecalciferol (VITAMIN D) 1000 UNITS tablet Take 4,000 Units by mouth daily.   Reported on 01/26/2016    . Collagen-Vitamin C (COLLAGEN PLUS VITAMIN C PO) Take 1 tablet by mouth daily. Reported on 12/22/2015    . escitalopram (LEXAPRO) 20 MG tablet Take 20 mg by mouth daily.     . fish oil-omega-3 fatty acids 1000 MG capsule Take 2 g by mouth daily.    .  gabapentin (NEURONTIN) 100 MG capsule Take 1 capsule (100 mg total) by mouth at bedtime. 90 capsule 2  . ibuprofen (ADVIL,MOTRIN) 200 MG tablet Take 800 mg by mouth every 8 (eight) hours as needed.    . letrozole (FEMARA) 2.5 MG tablet TAKE 1 TABLET (2.5 MG TOTAL) BY MOUTH DAILY. 30 tablet 0  . Multiple Vitamin (MULTIVITAMIN) tablet Take 1 tablet by mouth daily.    . omeprazole (PRILOSEC) 40 MG capsule Take 40 mg by mouth daily.    . Polyethyl Glycol-Propyl Glycol (SYSTANE OP) Place 1-2 drops into both eyes as needed (for dry eyes).    . Probiotic Product (PROBIOTIC PO) Take 1 capsule by mouth daily.     . tiotropium (SPIRIVA) 18 MCG inhalation capsule Place 18 mcg into inhaler and inhale daily.    . valACYclovir (VALTREX) 500 MG tablet Take 500 mg by mouth daily.     . XOPENEX HFA 45 MCG/ACT inhaler Inhale 2 puffs into the lungs every 4 (four) hours as needed for wheezing or shortness of breath.      No current facility-administered medications for this visit.     PHYSICAL EXAMINATION: ECOG PERFORMANCE STATUS: 0 - Asymptomatic  Vitals:   08/12/16 0859  BP: 119/71  Pulse: 87  Resp: 18  Temp: 98.4 F (36.9 C)   Filed Weights   08/12/16 0859  Weight: 139 lb 9.6 oz (63.3 kg)    GENERAL:alert, no distress and comfortable SKIN: skin color, texture, turgor are normal, no rashes or significant lesions EYES: normal, Conjunctiva are pink and non-injected, sclera clear OROPHARYNX:no exudate, no erythema and lips, buccal mucosa, and tongue normal  NECK: supple, thyroid normal size, non-tender, without nodularity LYMPH:  no palpable lymphadenopathy in the cervical, axillary or inguinal LUNGS: clear to auscultation and percussion with normal breathing effort HEART: regular rate & rhythm and no murmurs and no lower extremity edema ABDOMEN:abdomen soft, non-tender and normal bowel sounds MUSCULOSKELETAL:no cyanosis of digits and no clubbing  NEURO: alert & oriented x 3 with fluent speech, no  focal motor/sensory deficits EXTREMITIES: No lower extremity edema BREAST: No palpable masses or nodules in either right or left breasts. No palpable axillary supraclavicular or infraclavicular adenopathy no breast tenderness or nipple discharge. (exam performed in the presence of a chaperone)  LABORATORY DATA:  I have reviewed the data as listed   Chemistry      Component Value Date/Time   NA 144 10/27/2015 0621   NA 142 09/09/2015 1209   K 4.6 10/27/2015 0621   K 4.1 09/09/2015 1209   CL 109 10/27/2015 0621   CO2 24 10/27/2015 0621   CO2 29 09/09/2015 1209   BUN 11 10/27/2015 0621   BUN 10.2 09/09/2015 1209   CREATININE 0.78 10/27/2015 1626   CREATININE 0.9 09/09/2015 1209      Component Value Date/Time   CALCIUM 9.4 10/27/2015 0621   CALCIUM 9.4 09/09/2015 1209   ALKPHOS 54 10/27/2015 0621   ALKPHOS 54 09/09/2015 1209   AST 23 10/27/2015 0621   AST 17 09/09/2015 1209   ALT 16 10/27/2015 0621   ALT 11 09/09/2015 1209   BILITOT 0.5   10/27/2015 0621   BILITOT 0.45 09/09/2015 1209       Lab Results  Component Value Date   WBC 14.2 (H) 10/27/2015   HGB 11.2 (L) 10/27/2015   HCT 33.7 (L) 10/27/2015   MCV 96.3 10/27/2015   PLT 249 10/27/2015   NEUTROABS 4.2 10/27/2015    ASSESSMENT & PLAN:  Breast cancer of upper-outer quadrant of right female breast ([facility]) Bilateral mastectomies 10/27/2015: Right mastectomy: IDC grade 1, 2 foci, largest measures 1.8 cm, 2/6 lymph nodes positive T1cN1 stage II a, ER/PR positive HER-2 negative Ki-67 3%; left mastectomy: AL[facility]; Mammaprint low risk, luminal type A (did not need chemotherapy) Adjuvant radiation therapy from 12/09/2015 to 01/27/2016   Current treatment: Adjuvant antiestrogen therapy with anastrozole 1 mg by mouth daily switched to Letrozole 05/04/16 due to musculoskeletal Sx  Letrozole toxicities: 1. Hot flashes 2 or 3 times per day: I instructed her to take letrozole at bedtime. 2. skin dryness due to estrogen deficiency  from antiestrogen medication.  3. Tendinitis left wrist  Patient does not have any risk factors for cardiac disease is likely myocardial infarction. No risk of stroke and patient has never had any prior cardiac procedures.  Patient has agreed to participate in  ABC clinical trial.   Return to clinic in 6 months for follow-up   I spent 25 minutes talking to the patient of which more than half was spent in counseling and coordination of care.  No orders of the defined types were placed in this encounter.  The patient has a good understanding of the overall plan. she agrees with it. she will call with any problems that may develop before the next visit here.   Gudena, Vinay K, MD 08/12/16    

## 2016-08-15 ENCOUNTER — Other Ambulatory Visit: Payer: Self-pay | Admitting: *Deleted

## 2016-08-16 ENCOUNTER — Encounter: Payer: Self-pay | Admitting: *Deleted

## 2016-08-16 ENCOUNTER — Encounter: Payer: Self-pay | Admitting: Hematology and Oncology

## 2016-08-16 DIAGNOSIS — Z170 Estrogen receptor positive status [ER+]: Principal | ICD-10-CM

## 2016-08-16 DIAGNOSIS — C50411 Malignant neoplasm of upper-outer quadrant of right female breast: Secondary | ICD-10-CM

## 2016-08-18 ENCOUNTER — Encounter: Payer: 59 | Admitting: *Deleted

## 2016-08-18 ENCOUNTER — Other Ambulatory Visit: Payer: 59

## 2016-08-18 DIAGNOSIS — C50411 Malignant neoplasm of upper-outer quadrant of right female breast: Secondary | ICD-10-CM

## 2016-08-18 DIAGNOSIS — Z170 Estrogen receptor positive status [ER+]: Principal | ICD-10-CM

## 2016-08-18 LAB — RESEARCH LABS

## 2016-08-18 MED ORDER — INV-ASPIRIN/PLACEBO 300 MG TABS ALLIANCE A011502: 1.0000 | ORAL_TABLET | Freq: Every day | ORAL | 0 refills | Status: DC

## 2016-08-18 NOTE — Progress Notes (Signed)
08/18/16 at 11:54am - [facility] [phone] - cycle 1, day 1 study notes- The pt was into the cancer center today for her pre-study blood and urine samples and to receive her study drug.  The pt's blood was drawn and her urine sample was obtained for research purposes.  The pt met with the research nurse to discuss her study treatment. The pt was given her medication logs starting 08/18/16 through 02/14/17.  The pt was instructed on how to record her daily dose of study drug.  The pt was instructed to take her study drug with food or a full glass of water.  The nurse informed the pt to not crush the tablets.  The research nurse gave the pt her 2 bottles of study drug ( Aspirin 300 mg or placebo).  The pt was dispensed 180 tablets for a 6 month supply.  The pt was instructed to bring her medication diaries and pill bottles back at each protocol visit.  She denied having any questions.  The pt agreed to begin her study drug this evening at 7 pm.  The research nurse encouraged the pt to report any bleeding or other new side effects.  The pt was thanked for her support of this trial. Brion Aliment RN, BSN, CCRP  Clinical Research Nurse 08/18/2016 12:04 PM

## 2016-08-24 ENCOUNTER — Encounter: Payer: Self-pay | Admitting: *Deleted

## 2016-08-24 NOTE — Progress Notes (Signed)
08/24/16 at 9:22am - [facility] [phone] follow up call- The research nurse called and spoke to the pt about her study drug (aspirin/placebo).  The pt confirmed that she began her study drug on 08/18/16.  The pt denies any new health problems since she began the study drug.  The pt was thanked for her support of the study.  She states she takes her study drug every evening.  The pt was encouraged to call Dr. Lindi Adie with any new side effects or concerns about the study.  The pt verbalized understanding.   Brion Aliment RN, BSN, CCRP Clinical Research Nurse 08/24/2016 9:25 AM

## 2016-08-31 ENCOUNTER — Other Ambulatory Visit: Payer: Self-pay | Admitting: *Deleted

## 2016-08-31 MED ORDER — LETROZOLE 2.5 MG PO TABS: 3.0000 mg | ORAL_TABLET | Freq: Every day | ORAL | 3 refills | Status: DC

## 2016-09-07 DIAGNOSIS — M654 Radial styloid tenosynovitis [de Quervain]: Secondary | ICD-10-CM | POA: Diagnosis not present

## 2016-09-07 DIAGNOSIS — M79642 Pain in left hand: Secondary | ICD-10-CM | POA: Diagnosis not present

## 2016-09-08 ENCOUNTER — Ambulatory Visit: Payer: 59 | Admitting: Hematology and Oncology

## 2016-09-08 ENCOUNTER — Ambulatory Visit: Payer: 59 | Admitting: Radiation Oncology

## 2016-09-29 ENCOUNTER — Ambulatory Visit
Admission: RE | Admit: 2016-09-29 | Discharge: 2016-09-29 | Disposition: A | Discharge status: Home / Self Care | Payer: 59 | Source: Ambulatory Visit | Attending: Radiation Oncology | Admitting: Radiation Oncology

## 2016-09-29 DIAGNOSIS — C50411 Malignant neoplasm of upper-outer quadrant of right female breast: Secondary | ICD-10-CM | POA: Diagnosis present

## 2016-09-29 DIAGNOSIS — Z170 Estrogen receptor positive status [ER+]: Secondary | ICD-10-CM | POA: Insufficient documentation

## 2016-09-29 DIAGNOSIS — Z08 Encounter for follow-up examination after completed treatment for malignant neoplasm: Secondary | ICD-10-CM | POA: Diagnosis not present

## 2016-09-29 DIAGNOSIS — Z79811 Long term (current) use of aromatase inhibitors: Secondary | ICD-10-CM | POA: Diagnosis not present

## 2016-09-29 NOTE — Progress Notes (Signed)
Radiation Oncology         [phone] ________________________________  Name: Chelsea Patterson MRN: [id number]  Date: 09/29/2016  DOB: 1955/10/18  Follow-Up Visit Note  CC: Shirline Frees, MD  Fanny Skates, MD    ICD-9-CM ICD-10-CM   1. Malignant neoplasm of upper-outer quadrant of right breast in female, estrogen receptor positive ([place]) 174.4 C50.411    V86.0 Z17.0     Diagnosis: Stage IIa (mpT1c, pN1a) invasive ductal carcinoma of the right breast (ER/PR positive HER-2 negative)  Interval Since Last Radiation: 8 months  12/09/2015-01/27/2016: 1.) Right chest wall, 50.4 Gy at 28 fractions 2.) Right chest/mastectomy scar, 10 Gy at 5 fractions   Narrative:  The patient returns today for routine follow-up. Patient denies itching or redness in the treatment area. She denies lymphedema. She notes occasional fatigue. She continues to exercise 2-3 times per week. She reports leg pain associated with the Femara. She notes she is on a new investigational drug to prevent reoccurrence of cancer that she listed under home medications. She is unable to take Motrin for her leg pain due to the investigational drug. Patient also notes continued hot flashes. Per nursing, skin is intact without irritation or redness.  ALLERGIES:  is allergic to wellbutrin [bupropion]; latex; and prozac [fluoxetine hcl].  Meds: Current Outpatient Prescriptions  Medication Sig Dispense Refill  . acetaminophen (TYLENOL ARTHRITIS PAIN) 650 MG CR tablet Take 2,600 mg by mouth every 8 (eight) hours as needed for pain. Reported on 12/22/2015    . atorvastatin (LIPITOR) 40 MG tablet Take 40 mg by mouth daily at 6 PM.     . b complex vitamins tablet Take 1 tablet by mouth daily.    . cholecalciferol (VITAMIN D) 1000 UNITS tablet Take 4,000 Units by mouth daily. Reported on 01/26/2016    . Collagen-Vitamin C (COLLAGEN PLUS VITAMIN C PO) Take 1 tablet by mouth daily. Reported on 12/22/2015    . diphenhydramine-acetaminophen  (TYLENOL PM) 25-500 MG TABS tablet Take 1 tablet by mouth at bedtime as needed.    Marland Kitchen escitalopram (LEXAPRO) 20 MG tablet Take 20 mg by mouth daily.     . fish oil-omega-3 fatty acids 1000 MG capsule Take 2 g by mouth daily.    Marland Kitchen gabapentin (NEURONTIN) 100 MG capsule Take 1 capsule (100 mg total) by mouth at bedtime. 90 capsule 2  . Investigational aspirin/placebo 300 MG tablet Alliance [id number] Take 1 tablet by mouth daily. Take with food or a full glass of water.  Do not crush enteric coated tablets. 180 tablet 0  . letrozole (FEMARA) 2.5 MG tablet Take 1 tablet (2.5 mg total) by mouth daily. 30 tablet 3  . Multiple Vitamin (MULTIVITAMIN) tablet Take 1 tablet by mouth daily.    Vladimir Faster Glycol-Propyl Glycol (SYSTANE OP) Place 1-2 drops into both eyes as needed (for dry eyes).    Marland Kitchen tiotropium (SPIRIVA) 18 MCG inhalation capsule Place 18 mcg into inhaler and inhale daily.    . valACYclovir (VALTREX) 500 MG tablet Take 500 mg by mouth daily.     Penne Lash HFA 45 MCG/ACT inhaler Inhale 2 puffs into the lungs every 4 (four) hours as needed for wheezing or shortness of breath.     Marland Kitchen ibuprofen (ADVIL,MOTRIN) 200 MG tablet Take 800 mg by mouth every 8 (eight) hours as needed.    Marland Kitchen omeprazole (PRILOSEC) 40 MG capsule Take 40 mg by mouth daily.    . Probiotic Product (PROBIOTIC PO) Take 1 capsule by mouth  daily.      No current facility-administered medications for this encounter.     Physical Findings: The patient is in no acute distress. Patient is alert and oriented.  weight is 140 lb 12.8 oz (63.9 kg). Her oral temperature is 98.4 F (36.9 C). Her blood pressure is 113/69 and her pulse is 79. Her respiration is 18 and oxygen saturation is 100%.   Lungs are clear to auscultation bilaterally. Heart has regular rate and rhythm. No palpable cervical, supraclavicular, or axillary adenopathy. The left chest wall shows a mastectomy scar without palpable or visible signs of recurrence. The right chest  wall area also shows a mastectomy scar without palpable or visible signs of recurrence. She has some hyperpigmentation changes along the right side.  Lab Findings: Lab Results  Component Value Date   WBC 14.2 (H) 10/27/2015   HGB 11.2 (L) 10/27/2015   HCT 33.7 (L) 10/27/2015   MCV 96.3 10/27/2015   PLT 249 10/27/2015    Radiographic Findings: No results found.  Impression:  Stage IIa (mpT1c, pN1a) invasive ductal carcinoma of the right breast (ER/PR positive HER-2 negative)  No evidence of recurrence on clinical exam.  Plan: Follow up in radiation oncology prn. She will remain under close follow up with medical oncology.  ____________________________________ -----------------------------------  Blair Promise, PhD, MD  This document serves as a record of services personally performed by Gery Pray, MD. It was created on his behalf by Bethann Humble, a trained medical scribe. The creation of this record is based on the scribe's personal observations and the provider's statements to them. This document has been checked and approved by the attending provider.

## 2016-09-29 NOTE — Progress Notes (Addendum)
Chelsea Patterson here today for 6 month follow up for right breast cancer. Reports occassional fatigue.  She is continuing to exercise 2-3 times weekly.  Reports having leg pain that is associated with the Femara.  Is on new investigational drug to prevent reoccurrence of cancer which is listed in her home medications.  She is unable to take Motrin for the leg pain because of the investigational drug.  Skin is intact without any irritation or redness.  Vitals:   09/29/16 0937  BP: 113/69  Pulse: 79  Resp: 18  Temp: 98.4 F (36.9 C)  TempSrc: Oral  SpO2: 100%  Weight: 140 lb 12.8 oz (63.9 kg)    Wt Readings from Last 3 Encounters:  09/29/16 140 lb 12.8 oz (63.9 kg)  08/12/16 139 lb 9.6 oz (63.3 kg)  05/04/16 144 lb 1.6 oz (65.4 kg)

## 2016-10-28 DIAGNOSIS — M545 Low back pain: Secondary | ICD-10-CM | POA: Diagnosis not present

## 2017-01-09 ENCOUNTER — Other Ambulatory Visit: Payer: Self-pay | Admitting: Adult Health

## 2017-01-09 DIAGNOSIS — R232 Flushing: Secondary | ICD-10-CM

## 2017-01-09 DIAGNOSIS — T451X5A Adverse effect of antineoplastic and immunosuppressive drugs, initial encounter: Principal | ICD-10-CM

## 2017-01-10 NOTE — Telephone Encounter (Signed)
Mendel Ryder,   Do you mind refilling this for Ms. Epple and managing going forward please?   Mike Craze, NP [place] [phone]

## 2017-01-13 DIAGNOSIS — G5603 Carpal tunnel syndrome, bilateral upper limbs: Secondary | ICD-10-CM | POA: Diagnosis not present

## 2017-01-13 DIAGNOSIS — M654 Radial styloid tenosynovitis [de Quervain]: Secondary | ICD-10-CM | POA: Diagnosis not present

## 2017-01-13 DIAGNOSIS — M79641 Pain in right hand: Secondary | ICD-10-CM | POA: Diagnosis not present

## 2017-01-13 DIAGNOSIS — G5601 Carpal tunnel syndrome, right upper limb: Secondary | ICD-10-CM | POA: Diagnosis not present

## 2017-01-13 DIAGNOSIS — M79642 Pain in left hand: Secondary | ICD-10-CM | POA: Diagnosis not present

## 2017-01-16 ENCOUNTER — Ambulatory Visit (INDEPENDENT_AMBULATORY_CARE_PROVIDER_SITE_OTHER): Payer: 59 | Admitting: Nurse Practitioner

## 2017-01-16 ENCOUNTER — Other Ambulatory Visit (HOSPITAL_COMMUNITY)
Admission: RE | Admit: 2017-01-16 | Discharge: 2017-01-16 | Disposition: A | Discharge status: Home / Self Care | Payer: 59 | Source: Ambulatory Visit | Attending: Nurse Practitioner | Admitting: Nurse Practitioner

## 2017-01-16 ENCOUNTER — Encounter: Payer: Self-pay | Admitting: Nurse Practitioner

## 2017-01-16 VITALS — BP 110/64 | HR 68 | Resp 16 | Ht 67.25 in | Wt 136.0 lb

## 2017-01-16 DIAGNOSIS — Z01419 Encounter for gynecological examination (general) (routine) without abnormal findings: Secondary | ICD-10-CM | POA: Diagnosis not present

## 2017-01-16 DIAGNOSIS — Z0000 Encounter for general adult medical examination without abnormal findings: Secondary | ICD-10-CM | POA: Diagnosis not present

## 2017-01-16 DIAGNOSIS — Z170 Estrogen receptor positive status [ER+]: Secondary | ICD-10-CM

## 2017-01-16 DIAGNOSIS — C50411 Malignant neoplasm of upper-outer quadrant of right female breast: Secondary | ICD-10-CM | POA: Diagnosis not present

## 2017-01-16 NOTE — Patient Instructions (Addendum)
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
## 2017-01-17 LAB — CYTOLOGY - PAP
DIAGNOSIS: NEGATIVE
HPV (WINDOPATH): NOT DETECTED

## 2017-01-18 NOTE — Progress Notes (Signed)
Encounter reviewed by Dr. Brook Amundson C. Silva.  

## 2017-01-23 DIAGNOSIS — E7800 Pure hypercholesterolemia, unspecified: Secondary | ICD-10-CM | POA: Diagnosis not present

## 2017-01-23 DIAGNOSIS — J449 Chronic obstructive pulmonary disease, unspecified: Secondary | ICD-10-CM | POA: Diagnosis not present

## 2017-02-07 ENCOUNTER — Other Ambulatory Visit: Payer: Self-pay | Admitting: Hematology and Oncology

## 2017-02-08 ENCOUNTER — Telehealth: Payer: Self-pay

## 2017-02-08 NOTE — Telephone Encounter (Signed)
Called and left a message with appt reminder,reminder to bring study drug bottles along with diaries.

## 2017-02-09 ENCOUNTER — Encounter: Payer: Self-pay | Admitting: Hematology and Oncology

## 2017-02-09 ENCOUNTER — Ambulatory Visit (HOSPITAL_BASED_OUTPATIENT_CLINIC_OR_DEPARTMENT_OTHER): Payer: 59 | Admitting: Hematology and Oncology

## 2017-02-09 ENCOUNTER — Encounter: Payer: Self-pay | Admitting: *Deleted

## 2017-02-09 DIAGNOSIS — Z006 Encounter for examination for normal comparison and control in clinical research program: Secondary | ICD-10-CM

## 2017-02-09 DIAGNOSIS — Z170 Estrogen receptor positive status [ER+]: Principal | ICD-10-CM

## 2017-02-09 DIAGNOSIS — N951 Menopausal and female climacteric states: Secondary | ICD-10-CM | POA: Diagnosis not present

## 2017-02-09 DIAGNOSIS — T451X5A Adverse effect of antineoplastic and immunosuppressive drugs, initial encounter: Secondary | ICD-10-CM

## 2017-02-09 DIAGNOSIS — C50411 Malignant neoplasm of upper-outer quadrant of right female breast: Secondary | ICD-10-CM

## 2017-02-09 DIAGNOSIS — R232 Flushing: Secondary | ICD-10-CM

## 2017-02-09 DIAGNOSIS — Z79811 Long term (current) use of aromatase inhibitors: Secondary | ICD-10-CM | POA: Diagnosis not present

## 2017-02-09 MED ORDER — INV-ASPIRIN/PLACEBO 300 MG TABS ALLIANCE A011502: 1.0000 | ORAL_TABLET | Freq: Every day | ORAL | 0 refills | Status: DC

## 2017-02-09 MED ORDER — GABAPENTIN 300 MG PO CAPS: 300.0000 mg | ORAL_CAPSULE | Freq: Every day | ORAL | 0 refills | Status: DC

## 2017-02-09 NOTE — Progress Notes (Signed)
Patient Care Team: Shirline Frees, MD as PCP - General (Family Medicine) Kem Boroughs, [place] as Nurse Practitioner (Family Medicine) Sylvan Cheese, NP as Nurse Practitioner (Hematology and Oncology)  DIAGNOSIS:  Encounter Diagnoses  Name Primary?  . Malignant neoplasm of upper-outer quadrant of right breast in female, estrogen receptor positive ([place])   . Hot flashes related to aromatase inhibitor therapy     SUMMARY OF ONCOLOGIC HISTORY:   Breast cancer of upper-outer quadrant of right female breast ([place])   09/04/2015 Initial Diagnosis    Right breast biopsy invasive ductal carcinoma with papillary features, grade 1, ER/PR positive HER-2 negative Ki-67 3%; 6 month follow-up mamm: stable left breast calcifications but new right breast calcifications 2 sets 2 mm size (only one set biopsied)      10/27/2015 Surgery    Bilateral mastectomies [name]), right mastectomy: IDC grade 1, 2 foci, largest measures 1.8 cm, 2/6 lymph nodes positive T1cN1 stage II a, ER/PR positive HER-2 negative Ki-67 3%; left mastectomy: ALH.      10/27/2015 Oncotype testing    Mammaprint: Low risk, Luminal-type       12/09/2015 - 01/27/2016 Radiation Therapy    Adjuvant radiation therapy ([name]). Right chest wall, 50.4 Gy at 28 fractions.  Right chest/mastectomy scar boost, 10 Gy at 5 fractions       02/2016 -  Anti-estrogen oral therapy    Anastrozole 1 mg daily; switched to letrozole 2.5 mg daily 05/04/2016 due to myalgias and arthralgias      08/12/2016 Miscellaneous    ABC clinical trial (aspirin versus placebo)       CHIEF COMPLIANT: Follow-up on letrozole therapy, complaining of hot flashes  INTERVAL HISTORY: Chelsea Patterson is a 61 year old with above-mentioned history of some bilateral mastectomies for breast cancer who underwent bilateral mastectomy followed by radiation and is currently on letrozole therapy. She has been tolerating letrozole much better than anastrozole. Her  biggest complaint is hot flashes which keep her awake at night. She has been on gabapentin 100 mg at bedtime which has helped her slightly but not completely. She is also on clinical trial ABC. She has very occasional bruising. Denies any active bleeding issues.  REVIEW OF SYSTEMS:   Constitutional: Denies fevers, chills or abnormal weight loss Eyes: Denies blurriness of vision Ears, nose, mouth, throat, and face: Denies mucositis or sore throat Respiratory: Denies cough, dyspnea or wheezes Cardiovascular: Denies palpitation, chest discomfort Gastrointestinal:  Denies nausea, heartburn or change in bowel habits Skin: Denies abnormal skin rashes Lymphatics: Denies new lymphadenopathy or easy bruising Neurological:Denies numbness, tingling or new weaknesses Behavioral/Psych: Mood is stable, no new changes  Extremities: No lower extremity edema Breast:  denies any pain or lumps or nodules in either breasts All other systems were reviewed with the patient and are negative.  I have reviewed the past medical history, past surgical history, social history and family history with the patient and they are unchanged from previous note.  ALLERGIES:  is allergic to wellbutrin [bupropion]; latex; and prozac [fluoxetine hcl].  MEDICATIONS:  Current Outpatient Prescriptions  Medication Sig Dispense Refill  . acetaminophen (TYLENOL ARTHRITIS PAIN) 650 MG CR tablet Take 2,600 mg by mouth every 8 (eight) hours as needed for pain. Reported on 12/22/2015    . atorvastatin (LIPITOR) 40 MG tablet Take 40 mg by mouth daily at 6 PM.     . b complex vitamins tablet Take 1 tablet by mouth daily.    . cholecalciferol (VITAMIN D) 1000 UNITS tablet Take  4,000 Units by mouth daily. Reported on 01/26/2016    . Collagen-Vitamin C (COLLAGEN PLUS VITAMIN C PO) Take 1 tablet by mouth daily. Reported on 12/22/2015    . diphenhydramine-acetaminophen (TYLENOL PM) 25-500 MG TABS tablet Take 1 tablet by mouth at bedtime as needed.     Marland Kitchen escitalopram (LEXAPRO) 20 MG tablet Take 20 mg by mouth daily.     . fish oil-omega-3 fatty acids 1000 MG capsule Take 2 g by mouth daily.    Marland Kitchen gabapentin (NEURONTIN) 300 MG capsule Take 1 capsule (300 mg total) by mouth at bedtime. 90 capsule 0  . Investigational aspirin/placebo 300 MG tablet Alliance [id number] Take 1 tablet by mouth daily. Take with food or a full glass of water.  Do not crush enteric coated tablets. 180 tablet 0  . letrozole (FEMARA) 2.5 MG tablet TAKE 1 TABLET (2.5 MG TOTAL) BY MOUTH DAILY. 30 tablet 3  . Multiple Vitamin (MULTIVITAMIN) tablet Take 1 tablet by mouth daily.    Vladimir Faster Glycol-Propyl Glycol (SYSTANE OP) Place 1-2 drops into both eyes as needed (for dry eyes).    Marland Kitchen tiotropium (SPIRIVA) 18 MCG inhalation capsule Place 18 mcg into inhaler and inhale daily.    . valACYclovir (VALTREX) 500 MG tablet Take 500 mg by mouth daily.     Penne Lash HFA 45 MCG/ACT inhaler Inhale 2 puffs into the lungs every 4 (four) hours as needed for wheezing or shortness of breath.      No current facility-administered medications for this visit.     PHYSICAL EXAMINATION: ECOG PERFORMANCE STATUS: 1 - Symptomatic but completely ambulatory  Vitals:   02/09/17 0906  BP: 111/75  Pulse: 84  Resp: 16  Temp: 98.5 F (36.9 C)   Filed Weights   02/09/17 0906  Weight: 138 lb 11.2 oz (62.9 kg)    GENERAL:alert, no distress and comfortable SKIN: skin color, texture, turgor are normal, no rashes or significant lesions EYES: normal, Conjunctiva are pink and non-injected, sclera clear OROPHARYNX:no exudate, no erythema and lips, buccal mucosa, and tongue normal  NECK: supple, thyroid normal size, non-tender, without nodularity LYMPH:  no palpable lymphadenopathy in the cervical, axillary or inguinal LUNGS: clear to auscultation and percussion with normal breathing effort HEART: regular rate & rhythm and no murmurs and no lower extremity edema ABDOMEN:abdomen soft, non-tender and  normal bowel sounds MUSCULOSKELETAL:no cyanosis of digits and no clubbing  NEURO: alert & oriented x 3 with fluent speech, no focal motor/sensory deficits EXTREMITIES: No lower extremity edema BREAST: No palpable masses or nodules in either right or left breasts. No palpable axillary supraclavicular or infraclavicular adenopathy no breast tenderness or nipple discharge. (exam performed in the presence of a chaperone)  LABORATORY DATA:  I have reviewed the data as listed   Chemistry      Component Value Date/Time   NA 144 10/27/2015 0621   NA 142 09/09/2015 1209   K 4.6 10/27/2015 0621   K 4.1 09/09/2015 1209   CL 109 10/27/2015 0621   CO2 24 10/27/2015 0621   CO2 29 09/09/2015 1209   BUN 11 10/27/2015 0621   BUN 10.2 09/09/2015 1209   CREATININE 0.78 10/27/2015 1626   CREATININE 0.9 09/09/2015 1209      Component Value Date/Time   CALCIUM 9.4 10/27/2015 0621   CALCIUM 9.4 09/09/2015 1209   ALKPHOS 54 10/27/2015 0621   ALKPHOS 54 09/09/2015 1209   AST 23 10/27/2015 0621   AST 17 09/09/2015 1209   ALT 16  10/27/2015 0621   ALT 11 09/09/2015 1209   BILITOT 0.5 10/27/2015 0621   BILITOT 0.45 09/09/2015 1209       Lab Results  Component Value Date   WBC 14.2 (H) 10/27/2015   HGB 11.2 (L) 10/27/2015   HCT 33.7 (L) 10/27/2015   MCV 96.3 10/27/2015   PLT 249 10/27/2015   NEUTROABS 4.2 10/27/2015    ASSESSMENT & PLAN:  Breast cancer of upper-outer quadrant of right female breast ([place]) Bilateral mastectomies 10/27/2015: Right mastectomy: IDC grade 1, 2 foci, largest measures 1.8 cm, 2/6 lymph nodes positive T1cN1 stage II a, ER/PR positive HER-2 negative Ki-67 3%; left mastectomy: ALH; Mammaprint low risk, luminal type A (did not need chemotherapy) Adjuvant radiation therapy from 12/09/2015 to 01/27/2016   Current treatment: Adjuvant antiestrogen therapy with anastrozole 1 mg by mouth daily switched to Letrozole 05/04/16 due to musculoskeletal Sx  Letrozole  toxicities: 1. Hot flashes 2 or 3 times per day: I instructed her to take letrozole at bedtime. 2. skin dryness 3. Tendinitis left wrist: Much improved after injections Patient now has bilateral carpal tunnel syndrome  ABC clinical trial: No major toxicities to the study drug 1. Occasional bruising on the left thigh 2. patient bleeds slightly longer when she cuts her self No significant bleeding issues Denies any gastritis. She has not use any NSAIDs  Difficulty sleeping due to hot flashes: I increase the gabapentin to 300 mg at bedtime and instructed her to take letrozole at bedtime as well.  Return to clinic in February 2019 for follow-up   I spent 25 minutes talking to the patient of which more than half was spent in counseling and coordination of care.  No orders of the defined types were placed in this encounter.  The patient has a good understanding of the overall plan. she agrees with it. she will call with any problems that may develop before the next visit here.   Rulon Eisenmenger, MD 02/09/17

## 2017-02-09 NOTE — Assessment & Plan Note (Signed)
Bilateral mastectomies 10/27/2015: Right mastectomy: IDC grade 1, 2 foci, largest measures 1.8 cm, 2/6 lymph nodes positive T1cN1 stage II a, ER/PR positive HER-2 negative Ki-67 3%; left mastectomy: ALH; Mammaprint low risk, luminal type A (did not need chemotherapy) Adjuvant radiation therapy from 12/09/2015 to 01/27/2016   Current treatment: Adjuvant antiestrogen therapy with anastrozole 1 mg by mouth daily switched to Letrozole 05/04/16 due to musculoskeletal Sx  Letrozole toxicities: 1. Hot flashes 2 or 3 times per day: I instructed her to take letrozole at bedtime. 2. skin dryness 3. Tendinitis left wrist  ABC clinical trial: No toxicities to the study drug  Return to clinic in 6 months for follow-up

## 2017-02-09 NOTE — Progress Notes (Signed)
02/09/17 at 10:39am - [facility] [id number] ABC- Month 6 Visit- Patient into the cancer center this morning for her 6 month on-study visit.  The pt returned her completed medication logs for February 1st, 2018 through July 25th, 2018.  The pt states she takes her study drug at night, and she will continue her daily dosing of study medication.  The pt returned 2 bottles of study drug.  One bottle was empty, and the other bottle had 6 remaining pills inside.  The pill bottles were taken to the pharmacy and given to [facility], pharmacist, for the drug accountability check.  Two new bottles of study drug (aspirin 300mg /placebo x 90 tablets per bottle) were dispensed by pharmacists Raul Del and Redgie Grayer.  The pt was given the new study drug bottles and instructed to continue taking 1 pill daily.  The pt was seen and examined by Dr. Lindi Adie before her study drug was dispensed to her.  The pt reported that she is dealing with some bilateral carpal tunnel issues.  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  She said that she is also seeing the orthopedic doctor for some tendonitis and neck issues.  The pt said that her dry skin has improved after using Gold Bond cream.  She reports ongoing hot flashes at night.  Dr. Lindi Adie increased her gabapentin to 300mg .  Dr. Lindi Adie stated that he feels that her orthopedic problems are not related to her ABC study drug.  He said her hot flashes and dry skin were AE's prior to her starting the study.   The research nurse and Dr. Lindi Adie reviewed the "Solicited AE checklist" with the pt.  She specifically denied the following AE's:  Gastrointestinal bleeding, intracranial bleeding, nosebleeds (epistaxis), blood in her urine (hematuria), indigestion/heartburn (dyspepsia), and gastritis.  The pt did report some localized bruising on her legs.  Dr. Lindi Adie felt the pt had grade 1 bruising, and he felt that it was "probably" related to her  study drug.  The pt said that she feels that she is on the aspirin because she has noticed that she bleeds longer if she cuts herself.  The pt was asked if she has used any "non-protocol use of aspirin or other NSAIDs during this reporting period".  The pt answered "no".  The pt said that she only uses Tylenol PM to help her sleep.  The pt was given medication logs starting today through January 2019.  The pt was given her month 12 appointment for 08/08/17.  The pt was told that she will have enough study drug dispensed today to last until her next appointment.  The pt was told that if her bruising were to worsen or if she developed any new symptoms to call Dr. Lindi Adie.  The pt was thanked for her support and compliance on the study.  Medication log indicates no missed doses, and the pill count confirmed a calculated compliance of 99.4%.   Brion Aliment RN, BSN, [place] Clinical Research Nurse 02/09/2017 11:20 AM  [name] [phone]  02/09/2017  Adverse Event Log ABC study/ [facility] [id number] 6 month visit (date range 08/18/16 - 02/09/17)  Event Grade Onset Date Resolved Date Drug Name Attribution Treatment Comments  Bruising  1 02/09/17  Aspirin 300 mg or Placebo Probably None  "occasional bruising"

## 2017-02-10 DIAGNOSIS — M542 Cervicalgia: Secondary | ICD-10-CM | POA: Diagnosis not present

## 2017-05-11 DIAGNOSIS — H6123 Impacted cerumen, bilateral: Secondary | ICD-10-CM | POA: Diagnosis not present

## 2017-05-11 DIAGNOSIS — J00 Acute nasopharyngitis [common cold]: Secondary | ICD-10-CM | POA: Diagnosis not present

## 2017-05-12 ENCOUNTER — Other Ambulatory Visit: Payer: Self-pay | Admitting: Hematology and Oncology

## 2017-05-12 DIAGNOSIS — T451X5A Adverse effect of antineoplastic and immunosuppressive drugs, initial encounter: Principal | ICD-10-CM

## 2017-05-12 DIAGNOSIS — R232 Flushing: Secondary | ICD-10-CM

## 2017-07-01 ENCOUNTER — Other Ambulatory Visit: Payer: Self-pay | Admitting: Hematology and Oncology

## 2017-07-09 DIAGNOSIS — M7061 Trochanteric bursitis, right hip: Secondary | ICD-10-CM | POA: Diagnosis not present

## 2017-07-25 ENCOUNTER — Ambulatory Visit
Admission: RE | Admit: 2017-07-25 | Discharge: 2017-07-25 | Disposition: A | Discharge status: Home / Self Care | Payer: 59 | Source: Ambulatory Visit | Attending: Family Medicine | Admitting: Family Medicine

## 2017-07-25 ENCOUNTER — Other Ambulatory Visit: Payer: Self-pay | Admitting: Family Medicine

## 2017-07-25 DIAGNOSIS — J449 Chronic obstructive pulmonary disease, unspecified: Secondary | ICD-10-CM | POA: Diagnosis not present

## 2017-07-25 DIAGNOSIS — J439 Emphysema, unspecified: Secondary | ICD-10-CM | POA: Diagnosis not present

## 2017-07-25 DIAGNOSIS — E7800 Pure hypercholesterolemia, unspecified: Secondary | ICD-10-CM | POA: Diagnosis not present

## 2017-07-25 DIAGNOSIS — M791 Myalgia, unspecified site: Secondary | ICD-10-CM | POA: Diagnosis not present

## 2017-07-25 DIAGNOSIS — Z23 Encounter for immunization: Secondary | ICD-10-CM | POA: Diagnosis not present

## 2017-07-28 ENCOUNTER — Other Ambulatory Visit: Payer: Self-pay | Admitting: *Deleted

## 2017-08-07 NOTE — Assessment & Plan Note (Signed)
Bilateral mastectomies 10/27/2015: Right mastectomy: IDC grade 1, 2 foci, largest measures 1.8 cm, 2/6 lymph nodes positive T1cN1 stage II a, ER/PR positive HER-2 negative Ki-67 3%; left mastectomy: ALH; Mammaprint low risk, luminal type A (did not need chemotherapy) Adjuvant radiation therapy from 12/09/2015 to 01/27/2016   Current treatment: Adjuvant antiestrogen therapy with anastrozole 1 mg by mouth daily switched to Letrozole 05/04/16 due to musculoskeletal Sx  Letrozole toxicities: 1. Hot flashes 2 or 3 times per day: I instructed her to take letrozole at bedtime. 2. skin dryness 3. Tendinitis left wrist  ABC clinical trial: No toxicities to the study drug  Return to clinic in 6 months for follow-up

## 2017-08-08 ENCOUNTER — Inpatient Hospital Stay: Payer: 59 | Attending: Hematology and Oncology | Admitting: Hematology and Oncology

## 2017-08-08 ENCOUNTER — Telehealth: Payer: Self-pay | Admitting: Hematology and Oncology

## 2017-08-08 ENCOUNTER — Encounter: Payer: Self-pay | Admitting: *Deleted

## 2017-08-08 DIAGNOSIS — Z9013 Acquired absence of bilateral breasts and nipples: Secondary | ICD-10-CM | POA: Diagnosis not present

## 2017-08-08 DIAGNOSIS — C50411 Malignant neoplasm of upper-outer quadrant of right female breast: Secondary | ICD-10-CM | POA: Diagnosis not present

## 2017-08-08 DIAGNOSIS — Z170 Estrogen receptor positive status [ER+]: Principal | ICD-10-CM

## 2017-08-08 DIAGNOSIS — N951 Menopausal and female climacteric states: Secondary | ICD-10-CM | POA: Diagnosis not present

## 2017-08-08 DIAGNOSIS — Z006 Encounter for examination for normal comparison and control in clinical research program: Secondary | ICD-10-CM | POA: Insufficient documentation

## 2017-08-08 DIAGNOSIS — R58 Hemorrhage, not elsewhere classified: Secondary | ICD-10-CM | POA: Diagnosis not present

## 2017-08-08 DIAGNOSIS — Z923 Personal history of irradiation: Secondary | ICD-10-CM

## 2017-08-08 DIAGNOSIS — M791 Myalgia, unspecified site: Secondary | ICD-10-CM | POA: Diagnosis not present

## 2017-08-08 DIAGNOSIS — Z79811 Long term (current) use of aromatase inhibitors: Secondary | ICD-10-CM | POA: Diagnosis not present

## 2017-08-08 MED ORDER — INV-ASPIRIN/PLACEBO 300 MG TABS ALLIANCE A011502: 1.0000 | ORAL_TABLET | Freq: Every day | ORAL | 0 refills | Status: DC

## 2017-08-08 NOTE — Progress Notes (Signed)
Patient Care Team: Shirline Frees, MD as PCP - General (Family Medicine) Kem Boroughs, [place] as Nurse Practitioner (Family Medicine) Sylvan Cheese, NP as Nurse Practitioner (Hematology and Oncology)  DIAGNOSIS:  Encounter Diagnosis  Name Primary?  . Malignant neoplasm of upper-outer quadrant of right breast in female, estrogen receptor positive ([place])     SUMMARY OF ONCOLOGIC HISTORY:   Breast cancer of upper-outer quadrant of right female breast ([place])   09/04/2015 Initial Diagnosis    Right breast biopsy invasive ductal carcinoma with papillary features, grade 1, ER/PR positive HER-2 negative Ki-67 3%; 6 month follow-up mamm: stable left breast calcifications but new right breast calcifications 2 sets 2 mm size (only one set biopsied)      10/27/2015 Surgery    Bilateral mastectomies [name]), right mastectomy: IDC grade 1, 2 foci, largest measures 1.8 cm, 2/6 lymph nodes positive T1cN1 stage II a, ER/PR positive HER-2 negative Ki-67 3%; left mastectomy: ALH.      10/27/2015 Oncotype testing    Mammaprint: Low risk, Luminal-type       12/09/2015 - 01/27/2016 Radiation Therapy    Adjuvant radiation therapy ([name]). Right chest wall, 50.4 Gy at 28 fractions.  Right chest/mastectomy scar boost, 10 Gy at 5 fractions       02/2016 -  Anti-estrogen oral therapy    Anastrozole 1 mg daily; switched to letrozole 2.5 mg daily 05/04/2016 due to myalgias and arthralgias      08/12/2016 Miscellaneous    ABC clinical trial (aspirin versus placebo)       CHIEF COMPLIANT: Follow-up on antiestrogen therapy, ABC clinical trial  INTERVAL HISTORY: Chelsea Patterson is a 62 year old with above-mentioned history of bilateral mastectomies for right-sided breast cancer who did not need adjuvant chemotherapy.  She is received adjuvant radiation and has been on oral antiestrogen therapy since August 2017.  She continues to have significant hot flashes and the muscle aches and pains  related to antiestrogen therapy and statin have improved over the past 2 weeks since his statin was reduced.  Therefore I believe the majority of her symptoms related to her joint and muscle pains could be related to statin therapy. She denies any lumps or nodules in the breast.   REVIEW OF SYSTEMS:   Constitutional: Denies fevers, chills or abnormal weight loss Eyes: Denies blurriness of vision Ears, nose, mouth, throat, and face: Denies mucositis or sore throat Respiratory: Denies cough, dyspnea or wheezes Cardiovascular: Denies palpitation, chest discomfort Gastrointestinal:  Denies nausea, heartburn or change in bowel habits Skin: Denies abnormal skin rashes Lymphatics: Denies new lymphadenopathy or easy bruising Neurological:Denies numbness, tingling or new weaknesses Behavioral/Psych: Mood is stable, no new changes  Extremities: No lower extremity edema Breast:  denies any pain or lumps or nodules in either breasts All other systems were reviewed with the patient and are negative.  I have reviewed the past medical history, past surgical history, social history and family history with the patient and they are unchanged from previous note.  ALLERGIES:  is allergic to wellbutrin [bupropion]; latex; and prozac [fluoxetine hcl].  MEDICATIONS:  Current Outpatient Medications  Medication Sig Dispense Refill  . acetaminophen (TYLENOL ARTHRITIS PAIN) 650 MG CR tablet Take 2,600 mg by mouth every 8 (eight) hours as needed for pain. Reported on 12/22/2015    . atorvastatin (LIPITOR) 40 MG tablet Take 40 mg by mouth daily at 6 PM.     . b complex vitamins tablet Take 1 tablet by mouth daily.    Marland Kitchen  cholecalciferol (VITAMIN D) 1000 UNITS tablet Take 4,000 Units by mouth daily. Reported on 01/26/2016    . Collagen-Vitamin C (COLLAGEN PLUS VITAMIN C PO) Take 1 tablet by mouth daily. Reported on 12/22/2015    . diphenhydramine-acetaminophen (TYLENOL PM) 25-500 MG TABS tablet Take 1 tablet by mouth at  bedtime as needed.    Marland Kitchen escitalopram (LEXAPRO) 20 MG tablet Take 20 mg by mouth daily.     . fish oil-omega-3 fatty acids 1000 MG capsule Take 2 g by mouth daily.    Marland Kitchen gabapentin (NEURONTIN) 300 MG capsule TAKE 1 CAPSULE (300 MG TOTAL) BY MOUTH AT BEDTIME. 90 capsule 0  . Investigational aspirin/placebo 300 MG tablet [facility] [id number] Take 1 tablet by mouth daily. Take with food or a full glass of water.  Do not crush enteric coated tablets. 180 tablet 0  . letrozole (FEMARA) 2.5 MG tablet TAKE 1 TABLET (2.5 MG TOTAL) BY MOUTH DAILY. 30 tablet 3  . Multiple Vitamin (MULTIVITAMIN) tablet Take 1 tablet by mouth daily.    Vladimir Faster Glycol-Propyl Glycol (SYSTANE OP) Place 1-2 drops into both eyes as needed (for dry eyes).    Marland Kitchen tiotropium (SPIRIVA) 18 MCG inhalation capsule Place 18 mcg into inhaler and inhale daily.    . valACYclovir (VALTREX) 500 MG tablet Take 500 mg by mouth daily.     Penne Lash HFA 45 MCG/ACT inhaler Inhale 2 puffs into the lungs every 4 (four) hours as needed for wheezing or shortness of breath.      No current facility-administered medications for this visit.     PHYSICAL EXAMINATION: ECOG PERFORMANCE STATUS: 1 - Symptomatic but completely ambulatory  Vitals:   08/08/17 0914  BP: 113/70  Pulse: 92  Resp: 18  Temp: 98.6 F (37 C)  SpO2: 97%   Filed Weights   08/08/17 0914  Weight: 138 lb 1.6 oz (62.6 kg)    GENERAL:alert, no distress and comfortable SKIN: skin color, texture, turgor are normal, no rashes or significant lesions EYES: normal, Conjunctiva are pink and non-injected, sclera clear OROPHARYNX:no exudate, no erythema and lips, buccal mucosa, and tongue normal  NECK: supple, thyroid normal size, non-tender, without nodularity LYMPH:  no palpable lymphadenopathy in the cervical, axillary or inguinal LUNGS: clear to auscultation and percussion with normal breathing effort HEART: regular rate & rhythm and no murmurs and no lower extremity  edema ABDOMEN:abdomen soft, non-tender and normal bowel sounds MUSCULOSKELETAL:no cyanosis of digits and no clubbing  NEURO: alert & oriented x 3 with fluent speech, no focal motor/sensory deficits EXTREMITIES: No lower extremity edema BREAST: No palpable masses or nodules in either right or left breasts. No palpable axillary supraclavicular or infraclavicular adenopathy no breast tenderness or nipple discharge. (exam performed in the presence of a chaperone)  LABORATORY DATA:  I have reviewed the data as listed CMP Latest Ref Rng & Units 10/27/2015 10/27/2015 10/08/2015  Glucose 65 - 99 mg/dL - 96 89  BUN 6 - 20 mg/dL - 11 11  Creatinine 0.44 - 1.00 mg/dL 0.78 0.84 0.87  Sodium 135 - 145 mmol/L - 144 141  Potassium 3.5 - 5.1 mmol/L - 4.6 4.8  Chloride 101 - 111 mmol/L - 109 106  CO2 22 - 32 mmol/L - 24 28  Calcium 8.9 - 10.3 mg/dL - 9.4 9.6  Total Protein 6.5 - 8.1 g/dL - 6.5 6.7  Total Bilirubin 0.3 - 1.2 mg/dL - 0.5 0.5  Alkaline Phos 38 - 126 U/L - 54 59  AST 15 -  41 U/L - 23 34  ALT 14 - 54 U/L - 16 38    Lab Results  Component Value Date   WBC 14.2 (H) 10/27/2015   HGB 11.2 (L) 10/27/2015   HCT 33.7 (L) 10/27/2015   MCV 96.3 10/27/2015   PLT 249 10/27/2015   NEUTROABS 4.2 10/27/2015    ASSESSMENT & PLAN:  Breast cancer of upper-outer quadrant of right female breast ([place]) Bilateral mastectomies 10/27/2015: Right mastectomy: IDC grade 1, 2 foci, largest measures 1.8 cm, 2/6 lymph nodes positive T1cN1 stage II a, ER/PR positive HER-2 negative Ki-67 3%; left mastectomy: ALH; Mammaprint low risk, luminal type A (did not need chemotherapy) Adjuvant radiation therapy from 12/09/2015 to 01/27/2016   Current treatment: Adjuvant antiestrogen therapy with anastrozole 1 mg by mouth daily switched to Letrozole 05/04/16 due to musculoskeletal Sx  Letrozole toxicities: 1. Hot flashes 2 or 3 times per day: I instructed her to take gabapentin 300 mg in the afternoon and 300 mg at  bedtime 2. skin dryness 3.  Muscle aches and pains much improved since statin has been decreased  ABC clinical trial: Mild bruising when she bumps into objects especially in the right thigh area  Return to clinic in 6 months for follow-up   I spent 25 minutes talking to the patient of which more than half was spent in counseling and coordination of care.  No orders of the defined types were placed in this encounter.  The patient has a good understanding of the overall plan. she agrees with it. she will call with any problems that may develop before the next visit here.   Harriette Ohara, MD 08/08/17

## 2017-08-08 NOTE — Progress Notes (Signed)
08/08/17 at 10:23am - [facility] [phone] - month 12 study notes- Patient into the cancer center this morning for her 12 month on-study visit.  The pt returned her completed medication logs for July 26th, 2018 through January 21st, 2019.  The pt confirmed that she did not take her study drug today.  The pt states she takes her study drug at night.  The pt returned 2 bottles of study drug.  One bottle was empty, and the other bottle had 1 remaining pill inside.  The pill bottles were taken to the pharmacy and given to Raul Del, pharmacist, for the drug accountability check. The pt was given the [facility] patient letter alerting the pt to the recent findings of the ASPREE study.  The pt was encouraged to discuss this letter with Dr. Lindi Adie.  The pt was seen and examined by Dr. Lindi Adie before her study drug was dispensed to her. Dr. Lindi Adie went over the findings of the ASPREE study and answered all of the pt's questions.    She reports ongoing dry skin and hot flashes in the evenings. She feels that the gabapentin has helped her sleep better in regards to her hot flashes at night.  The pt also complained of some muscular pain.  She said that her PCP decreased the frequency of her atorvastatin to weekly, and she has seen a "big improvement".  Dr. Lindi Adie stated that he feels that her orthopedic problems are not related to her ABC study drug.  He said her hot flashes/dry skin were present prior to her starting the study drug and is related to her letrozole.   The research nurse and Dr. Lindi Adie reviewed the "Solicited AE checklist" with the pt.  She specifically denied the following AE's:  Gastrointestinal bleeding, intracranial bleeding, nosebleeds (epistaxis), blood in her urine (hematuria), indigestion/heartburn (dyspepsia), and gastritis.  The pt did report some localized bruising on occasion when she "bumps into things".  Dr. Lindi Adie felt the pt had grade 1 bruising, and he felt that it was "probably" related to her  study drug.  The pt was asked if she has used any "non-protocol use of aspirin or other NSAIDs during this reporting period".  The pt answered "no".  The pt said that she only uses Tylenol PM to help her sleep.  The pt asked Dr. Lindi Adie about her risk of recurrence and the need for additional scans.  He said that he did not feel that any scans were necessary at this time. The pt said that she has routine chest x-rays to evaluate her COPD.  Dr. Lindi Adie said that chest x-rays are not the best at detecting cancer,  The pt was given medication logs starting today through July 2019.  The pt was given her month 18 appointment for 01/31/18.  The pt was told that she will have enough study drug dispensed today to last until her next appointment.  The pt was told that if her bruising were to worsen or if she developed any new symptoms to call Dr. Lindi Adie. The pt said that she was comfortable continuing her participation in the study.  The pt was given the new consent form to read after her MD visit.  The pt had no further questions/concerns about her study participation.  The pt signed the consent form, and a copy was given to the pt for her records.  The pt was thanked for her support and compliance on the study.  Medication logs indicate no missed doses, and the pill count  confirmed a calculated compliance of 99.4%.  Two new bottles of study drug (aspirin 300mg /placebo x 90 tablets per bottle) were dispensed by pharmacist, Raul Del.  The pt was given the new study drug bottles and instructed to continue taking 1 pill daily.  Brion Aliment RN, BSN, [employer] Clinical Research Nurse 08/08/2017 10:32 AM    [name] [phone]  08/08/2017  Adverse Event Log ABC study/ [facility] [id number] 12 month visit (date range 02/10/17 - 08/08/17)  According to section 9.2 of the protocol the following AE's are not reportable: hot flashes, muscle aches/pains, and dry skin due to their attribution  Event Grade Onset Date  Resolved Date Drug Name Attribution Treatment Comments  Bruising  1 02/09/17 ongoing Aspirin 300 mg or Placebo Probably None  Mild bruising when she bumps into objects   Hot flashes  2 Prior to study drug ongoing Aspirin 300 mg or Placebo  Unrelated Pt takes gabapentin  MD feels that this AE is related to the pt's letrozole   Muscle aches/pains 2  Prior to study drug Ongoing  Aspirin 300 mg or Placebo Unrelated  MD feels that this AE is related to the pt's letrozole  and statin use  Dry skin  1 Prior to study drug  Ongoing  Aspirin 300 mg or Placebo  Unrelated  uses Gold Bond cream MD feels that this AE is related to the pt's letrozole

## 2017-08-08 NOTE — Telephone Encounter (Signed)
Gave patient avs report and appointments for July.  °

## 2017-08-09 ENCOUNTER — Other Ambulatory Visit: Payer: Self-pay | Admitting: Hematology and Oncology

## 2017-08-09 DIAGNOSIS — R232 Flushing: Secondary | ICD-10-CM

## 2017-08-09 DIAGNOSIS — T451X5A Adverse effect of antineoplastic and immunosuppressive drugs, initial encounter: Principal | ICD-10-CM

## 2017-08-21 ENCOUNTER — Ambulatory Visit: Payer: 59 | Admitting: Hematology and Oncology

## 2017-11-06 ENCOUNTER — Other Ambulatory Visit: Payer: Self-pay | Admitting: Hematology and Oncology

## 2017-11-06 DIAGNOSIS — T451X5A Adverse effect of antineoplastic and immunosuppressive drugs, initial encounter: Principal | ICD-10-CM

## 2017-11-06 DIAGNOSIS — R232 Flushing: Secondary | ICD-10-CM

## 2017-11-23 DIAGNOSIS — D0471 Carcinoma in situ of skin of right lower limb, including hip: Secondary | ICD-10-CM | POA: Diagnosis not present

## 2017-12-08 DIAGNOSIS — D0471 Carcinoma in situ of skin of right lower limb, including hip: Secondary | ICD-10-CM | POA: Diagnosis not present

## 2017-12-12 DIAGNOSIS — M545 Low back pain, unspecified: Secondary | ICD-10-CM | POA: Insufficient documentation

## 2017-12-12 DIAGNOSIS — M7061 Trochanteric bursitis, right hip: Secondary | ICD-10-CM | POA: Insufficient documentation

## 2017-12-12 DIAGNOSIS — M25551 Pain in right hip: Secondary | ICD-10-CM | POA: Diagnosis not present

## 2017-12-19 ENCOUNTER — Telehealth: Payer: Self-pay | Admitting: Hematology and Oncology

## 2017-12-19 NOTE — Telephone Encounter (Signed)
[facility] reschedule.  Spoke w/ pt.  Rescheduled appt from 7/17 to 7/16.  Research active - called Nikki to verify okay to move pt appt.  Mailed calendar.

## 2017-12-20 ENCOUNTER — Telehealth: Payer: Self-pay

## 2017-12-20 NOTE — Telephone Encounter (Signed)
Returned patient call concerning her upcoming appointment confusion. Per 6/5  Voice mail the only appointment date listed is 7/16.

## 2018-01-19 DIAGNOSIS — M1612 Unilateral primary osteoarthritis, left hip: Secondary | ICD-10-CM | POA: Diagnosis not present

## 2018-01-25 DIAGNOSIS — E7800 Pure hypercholesterolemia, unspecified: Secondary | ICD-10-CM | POA: Diagnosis not present

## 2018-01-25 DIAGNOSIS — J449 Chronic obstructive pulmonary disease, unspecified: Secondary | ICD-10-CM | POA: Diagnosis not present

## 2018-01-26 ENCOUNTER — Encounter: Payer: Self-pay | Admitting: Certified Nurse Midwife

## 2018-01-26 ENCOUNTER — Ambulatory Visit: Payer: 59 | Admitting: Certified Nurse Midwife

## 2018-01-26 ENCOUNTER — Other Ambulatory Visit: Payer: Self-pay

## 2018-01-26 VITALS — BP 114/70 | HR 72 | Resp 16 | Ht 67.0 in | Wt 127.0 lb

## 2018-01-26 DIAGNOSIS — E559 Vitamin D deficiency, unspecified: Secondary | ICD-10-CM

## 2018-01-26 DIAGNOSIS — Z01419 Encounter for gynecological examination (general) (routine) without abnormal findings: Secondary | ICD-10-CM | POA: Diagnosis not present

## 2018-01-26 DIAGNOSIS — Z853 Personal history of malignant neoplasm of breast: Secondary | ICD-10-CM | POA: Diagnosis not present

## 2018-01-26 DIAGNOSIS — N952 Postmenopausal atrophic vaginitis: Secondary | ICD-10-CM

## 2018-01-26 NOTE — Patient Instructions (Signed)
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
## 2018-01-27 LAB — VITAMIN D 25 HYDROXY (VIT D DEFICIENCY, FRACTURES): Vit D, 25-Hydroxy: 89.1 ng/mL (ref 30.0–100.0)

## 2018-01-30 ENCOUNTER — Inpatient Hospital Stay: Payer: 59 | Attending: Hematology and Oncology | Admitting: Hematology and Oncology

## 2018-01-30 ENCOUNTER — Encounter: Payer: Self-pay | Admitting: *Deleted

## 2018-01-30 ENCOUNTER — Telehealth: Payer: Self-pay | Admitting: Hematology and Oncology

## 2018-01-30 DIAGNOSIS — Z170 Estrogen receptor positive status [ER+]: Principal | ICD-10-CM

## 2018-01-30 DIAGNOSIS — Z9013 Acquired absence of bilateral breasts and nipples: Secondary | ICD-10-CM | POA: Diagnosis not present

## 2018-01-30 DIAGNOSIS — C50411 Malignant neoplasm of upper-outer quadrant of right female breast: Secondary | ICD-10-CM

## 2018-01-30 DIAGNOSIS — N951 Menopausal and female climacteric states: Secondary | ICD-10-CM

## 2018-01-30 DIAGNOSIS — M719 Bursopathy, unspecified: Secondary | ICD-10-CM | POA: Diagnosis not present

## 2018-01-30 DIAGNOSIS — Z79811 Long term (current) use of aromatase inhibitors: Secondary | ICD-10-CM | POA: Diagnosis not present

## 2018-01-30 DIAGNOSIS — Z923 Personal history of irradiation: Secondary | ICD-10-CM | POA: Insufficient documentation

## 2018-01-30 DIAGNOSIS — Z006 Encounter for examination for normal comparison and control in clinical research program: Secondary | ICD-10-CM | POA: Diagnosis not present

## 2018-01-30 MED ORDER — INV-ASPIRIN/PLACEBO 300 MG TABS ALLIANCE A011502: 1.0000 | ORAL_TABLET | Freq: Every day | ORAL | 0 refills | Status: DC

## 2018-01-30 MED ORDER — LETROZOLE 2.5 MG PO TABS: 2.5000 mg | ORAL_TABLET | Freq: Every day | ORAL | 3 refills | Status: DC

## 2018-01-30 NOTE — Progress Notes (Signed)
Patient Care Team: Shirline Frees, MD as PCP - General (Family Medicine) Kem Boroughs, [place] as Nurse Practitioner (Family Medicine) Sylvan Cheese, NP as Nurse Practitioner (Hematology and Oncology)  DIAGNOSIS:  Encounter Diagnosis  Name Primary?  . Malignant neoplasm of upper-outer quadrant of right breast in female, estrogen receptor positive ([place])     SUMMARY OF ONCOLOGIC HISTORY:   Breast cancer of upper-outer quadrant of right female breast ([place])   09/04/2015 Initial Diagnosis    Right breast biopsy invasive ductal carcinoma with papillary features, grade 1, ER/PR positive HER-2 negative Ki-67 3%; 6 month follow-up mamm: stable left breast calcifications but new right breast calcifications 2 sets 2 mm size (only one set biopsied)      10/27/2015 Surgery    Bilateral mastectomies [name]), right mastectomy: IDC grade 1, 2 foci, largest measures 1.8 cm, 2/6 lymph nodes positive T1cN1 stage II a, ER/PR positive HER-2 negative Ki-67 3%; left mastectomy: ALH.      10/27/2015 Oncotype testing    Mammaprint: Low risk, Luminal-type       12/09/2015 - 01/27/2016 Radiation Therapy    Adjuvant radiation therapy ([name]). Right chest wall, 50.4 Gy at 28 fractions.  Right chest/mastectomy scar boost, 10 Gy at 5 fractions       02/2016 -  Anti-estrogen oral therapy    Anastrozole 1 mg daily; switched to letrozole 2.5 mg daily 05/04/2016 due to myalgias and arthralgias      08/12/2016 Miscellaneous    ABC clinical trial (aspirin versus placebo)       CHIEF COMPLIANT: Follow-up on letrozole and aspirin versus placebo on the ABC trial  INTERVAL HISTORY: Chelsea Patterson is a 62 year old with above-mentioned history of right breast cancer treated with bilateral mastectomies radiation and now on antiestrogen therapy with letrozole.  She does have profound hot flashes which seem to happen in spite of gabapentin.  She has occasional bruises as a result of aspirin versus placebo.   Denies any GI problems.  REVIEW OF SYSTEMS:   Constitutional: Denies fevers, chills or abnormal weight loss Eyes: Denies blurriness of vision Ears, nose, mouth, throat, and face: Denies mucositis or sore throat Respiratory: Denies cough, dyspnea or wheezes Cardiovascular: Denies palpitation, chest discomfort Gastrointestinal:  Denies nausea, heartburn or change in bowel habits Skin: Denies abnormal skin rashes Lymphatics: Denies new lymphadenopathy or easy bruising Neurological:Denies numbness, tingling or new weaknesses Behavioral/Psych: Mood is stable, no new changes  Extremities: No lower extremity edema Breast: Bilateral mastectomies All other systems were reviewed with the patient and are negative.  I have reviewed the past medical history, past surgical history, social history and family history with the patient and they are unchanged from previous note.  ALLERGIES:  is allergic to wellbutrin [bupropion]; latex; and prozac [fluoxetine hcl].  MEDICATIONS:  Current Outpatient Medications  Medication Sig Dispense Refill  . acetaminophen (TYLENOL ARTHRITIS PAIN) 650 MG CR tablet Take 2,600 mg by mouth every 8 (eight) hours as needed for pain. Reported on 12/22/2015    . acyclovir (ZOVIRAX) 400 MG tablet acyclovir 400 mg tablet  TAKE 1 TABLET BY MOUTH TWICE A DAY    . atorvastatin (LIPITOR) 40 MG tablet Take 40 mg by mouth daily at 6 PM.     . b complex vitamins tablet Take 1 tablet by mouth daily.    . cholecalciferol (VITAMIN D) 1000 UNITS tablet Take by mouth daily. Takes between 2-4    . Collagen-Vitamin C (COLLAGEN PLUS VITAMIN C PO) Take 1 tablet by  mouth daily. Reported on 12/22/2015    . diphenhydramine-acetaminophen (TYLENOL PM) 25-500 MG TABS tablet Take 1 tablet by mouth at bedtime.     Marland Kitchen escitalopram (LEXAPRO) 20 MG tablet Take 20 mg by mouth daily.     . fish oil-omega-3 fatty acids 1000 MG capsule Take 2 g by mouth daily.    Marland Kitchen gabapentin (NEURONTIN) 300 MG capsule TAKE 1  CAPSULE (300 MG TOTAL) BY MOUTH AT BEDTIME. 90 capsule 0  . Investigational aspirin/placebo 300 MG tablet [facility] [id number] Take 1 tablet by mouth daily. Take with food or a full glass of water.  Do not crush enteric coated tablets. 180 tablet 0  . letrozole (FEMARA) 2.5 MG tablet TAKE 1 TABLET (2.5 MG TOTAL) BY MOUTH DAILY. 30 tablet 3  . Multiple Vitamin (MULTIVITAMIN) tablet Take 1 tablet by mouth daily.    Vladimir Faster Glycol-Propyl Glycol (SYSTANE OP) Place 1-2 drops into both eyes as needed (for dry eyes).    Marland Kitchen tiotropium (SPIRIVA) 18 MCG inhalation capsule Place 18 mcg into inhaler and inhale daily.    . varenicline (CHANTIX) 1 MG tablet Chantix 1 mg tablet  TAKE 1 TABLET BY MOUTH TWICE A DAY    . XOPENEX HFA 45 MCG/ACT inhaler Inhale 2 puffs into the lungs every 4 (four) hours as needed for wheezing or shortness of breath.      No current facility-administered medications for this visit.     PHYSICAL EXAMINATION: ECOG PERFORMANCE STATUS: 1 - Symptomatic but completely ambulatory  Vitals:   01/30/18 1336  BP: 109/69  Pulse: 87  Resp: 18  Temp: 98.1 F (36.7 C)  SpO2: 99%   Filed Weights   01/30/18 1336  Weight: 130 lb 8 oz (59.2 kg)    GENERAL:alert, no distress and comfortable SKIN: skin color, texture, turgor are normal, no rashes or significant lesions EYES: normal, Conjunctiva are pink and non-injected, sclera clear OROPHARYNX:no exudate, no erythema and lips, buccal mucosa, and tongue normal  NECK: supple, thyroid normal size, non-tender, without nodularity LYMPH:  no palpable lymphadenopathy in the cervical, axillary or inguinal LUNGS: clear to auscultation and percussion with normal breathing effort HEART: regular rate & rhythm and no murmurs and no lower extremity edema ABDOMEN:abdomen soft, non-tender and normal bowel sounds MUSCULOSKELETAL:no cyanosis of digits and no clubbing  NEURO: alert & oriented x 3 with fluent speech, no focal motor/sensory  deficits EXTREMITIES: No lower extremity edema BREAST: No palpable lumps or nodules in bilateral chest walls or axilla (exam performed in the presence of a chaperone)  LABORATORY DATA:  I have reviewed the data as listed CMP Latest Ref Rng & Units 10/27/2015 10/27/2015 10/08/2015  Glucose 65 - 99 mg/dL - 96 89  BUN 6 - 20 mg/dL - 11 11  Creatinine 0.44 - 1.00 mg/dL 0.78 0.84 0.87  Sodium 135 - 145 mmol/L - 144 141  Potassium 3.5 - 5.1 mmol/L - 4.6 4.8  Chloride 101 - 111 mmol/L - 109 106  CO2 22 - 32 mmol/L - 24 28  Calcium 8.9 - 10.3 mg/dL - 9.4 9.6  Total Protein 6.5 - 8.1 g/dL - 6.5 6.7  Total Bilirubin 0.3 - 1.2 mg/dL - 0.5 0.5  Alkaline Phos 38 - 126 U/L - 54 59  AST 15 - 41 U/L - 23 34  ALT 14 - 54 U/L - 16 38    Lab Results  Component Value Date   WBC 14.2 (H) 10/27/2015   HGB 11.2 (L) 10/27/2015   HCT  33.7 (L) 10/27/2015   MCV 96.3 10/27/2015   PLT 249 10/27/2015   NEUTROABS 4.2 10/27/2015    ASSESSMENT & PLAN:  Breast cancer of upper-outer quadrant of right female breast ([place]) Bilateral mastectomies 10/27/2015: Right mastectomy: IDC grade 1, 2 foci, largest measures 1.8 cm, 2/6 lymph nodes positive T1cN1 stage II a, ER/PR positive HER-2 negative Ki-67 3%; left mastectomy: ALH; Mammaprint low risk, luminal type A (did not need chemotherapy) Adjuvant radiation therapy from 12/09/2015 to 01/27/2016   Current treatment: Adjuvant antiestrogen therapy with anastrozole 1 mg by mouth daily switched to Letrozole 05/04/16 due to musculoskeletal Sx  Letrozole toxicities: 1. Hot flashes 2 or 3 times per day: Continues to be present in spite of gabapentin.  Patient wants to stop taking gabapentin.  She will taper it and then discontinue.  If her hot flashes get worse then she will start taking it again. 2. skin dryness 3. Tendinitis left wrist and now bursitis in bilateral hips getting steroid injections  ABC clinical trial: Grade 1 bruises, denies any GI upset  Return to  clinic in 6 months for follow-up    No orders of the defined types were placed in this encounter.  The patient has a good understanding of the overall plan. she agrees with it. she will call with any problems that may develop before the next visit here.   Harriette Ohara, MD 01/30/18

## 2018-01-30 NOTE — Progress Notes (Signed)
01/30/18- Alliance [id number] - month 18 study notes- Patient into the cancer center this afternoon for her 18 month on-study visit. The pt forgot to return her completed medication logs for Jan.22, 2019 through July 15th, 2019. She said that she forgot to bring them into the clinic today.  She says she has them on the side of her refrigerator.  The pt was given a self-addressed envelope, and she was told to mail all of her medication logs to the research nurse as soon as possible.  The pt said that she would put them in the mail tomorrow.  The pt confirmed that she did not take her study drug today.  The pt states she takes her study drug in the evening. The pt returned 2 bottles of study drug. One bottle was empty, and the other bottle had 15 remaining pills inside. The pill bottles were taken to the pharmacy and given to Lovina Reach, pharmacist, for the drug accountability check.  She reports ongoing dry skin and hot flashes mainly in the evenings. She is unsure if the gabapentin is helping her anymore.  Dr. Lindi Adie advised the pt to try reducing her gabapentin and see if she notices any increased hot flashes. The pt verbalized understanding with Dr. Geralyn Flash suggested plan to reduce her gabapentin. The pt also complained of bilateral hip bursitis.  The pt said that she has had several injections for symptom relief.    Dr. Lindi Adie stated that he feels that her orthopedic problems are not related to her ABC study drug. He said her hot flashes/dry skin were present prior to her starting the study drug and is related to her letrozole. The research nurse and Dr. Lindi Adie reviewed the "Solicited AE checklist" with the pt. She specifically denied the following AE's: Gastrointestinal bleeding, intracranial bleeding, nosebleeds (epistaxis), blood in her urine (hematuria), indigestion/heartburn (dyspepsia), and gastritis. The pt did report some localized bruising on occasion when she "bumps into things". Dr. Lindi Adie  felt the pt had grade 1 bruising, and he felt that it was "probably" related to her study drug. The pt was asked if she has used any "non-protocol use of aspirin or other NSAIDs during this reporting period". The pt answered "no". The pt said that she only uses Tylenol.  The pt was given medication logs starting today through January 2020. The pt was given her month 24 appointment for 07/27/18. The pt was told that she will have enough study drug dispensed today to last until her next appointment. The pt was told that if her bruising were to worsen or if she developed any new symptoms to call Dr. Lindi Adie. The pt said that she was comfortable continuing her participation in the study. The pt was thanked for her support and compliance on the study. Medication logs indicate no missed doses, and the pill count confirmed a calculated compliance of 94.3%. Two new bottles of study drug (aspirin 300mg /placebo x 90 tablets per bottle) were dispensed by pharmacist, Lovina Reach. The pt was given the new study drug bottles and instructed to continue taking 1 pill daily.  Brion Aliment RN, BSN, [place] Clinical Research Nurse 01/30/2018 2:48 PM   Aaron Edelman [place] [phone]  01/30/2018  Adverse Event Log ABC study/ [facility] [id number] (date range 08/09/17 - 01/30/18)  According to section 9.2 of the protocol the following AE's are not reportable: hot flashes, muscle aches/pains, and dry skin due to their attribution  Event Grade Onset Date Resolved Date Drug Name Attribution Treatment Comments  Bruising  1 02/09/17 ongoing Aspirin 300 mg or Placebo Probably None  Mild bruising when she bumps into objects   Hot flashes  2 Prior to study drug ongoing Aspirin 300 mg or Placebo  Unrelated Pt takes gabapentin  Related to pt's letrozole   Musculo- skeletal and connective tissue disorder, other (bursitis and tendonitis) 2  Prior to study drug Ongoing  Aspirin 300 mg or Placebo Unrelated steroid injections Pt  complains of hip bursitis and wrist tendonitis  Dry skin  1 Prior to study drug  Ongoing  Aspirin 300 mg or Placebo  Unrelated  uses Gold Bond cream MD feels that this AE is related to the pt's letrozole   02/06/18 at 2:21pm - The research nurse received the pt's medication logs in the mail this afternoon.  The pt reported that she took her study drug everyday.  The nurse confirmed that the pt took the study drug starting 08/08/17 through 01/29/18 (175 days).  The pt's confirmed compliance is still 94.3%.  The research nurse also notified Ander Gaster about the pt's use of steroid injections for bursitis.  Mickel Baas sent the issue to Dr. Bridgett Larsson to see if that is allowed on the trial. The research nurse could not find where steroid injections are prohibited on the trial.  Will await Dr. Lianne Moris response. Brion Aliment RN, BSN, [facility] Clinical Research Nurse 02/06/2018 2:27 PM   02/07/18 at 11:50am - Dr. Bridgett Larsson responded to the nurse's email about steroid injection use during trial participation.  Dr. Bridgett Larsson stated "steroid injections are allowed".   Brion Aliment RN, BSN, [facility] Clinical Research Nurse 02/07/2018 11:52 AM

## 2018-01-30 NOTE — Assessment & Plan Note (Signed)
Bilateral mastectomies 10/27/2015: Right mastectomy: IDC grade 1, 2 foci, largest measures 1.8 cm, 2/6 lymph nodes positive T1cN1 stage II a, ER/PR positive HER-2 negative Ki-67 3%; left mastectomy: ALH; Mammaprint low risk, luminal type A (did not need chemotherapy) Adjuvant radiation therapy from 12/09/2015 to 01/27/2016   Current treatment: Adjuvant antiestrogen therapy with anastrozole 1 mg by mouth daily switched to Letrozole 05/04/16 due to musculoskeletal Sx  Letrozole toxicities: 1. Hot flashes 2 or 3 times per day: I instructed her to take letrozole at bedtime. 2. skin dryness 3. Tendinitis left wrist  ABC clinical trial: No toxicities to the study drug  Return to clinic in 6 months for follow-up

## 2018-01-30 NOTE — Telephone Encounter (Signed)
Gave patient avs and calendar of upcoming jan appts. °

## 2018-01-31 ENCOUNTER — Ambulatory Visit: Payer: 59 | Admitting: Hematology and Oncology

## 2018-02-11 ENCOUNTER — Other Ambulatory Visit: Payer: Self-pay | Admitting: Hematology and Oncology

## 2018-02-11 DIAGNOSIS — R232 Flushing: Secondary | ICD-10-CM

## 2018-02-11 DIAGNOSIS — T451X5A Adverse effect of antineoplastic and immunosuppressive drugs, initial encounter: Principal | ICD-10-CM

## 2018-02-13 ENCOUNTER — Telehealth: Payer: Self-pay

## 2018-02-13 NOTE — Telephone Encounter (Signed)
Received refill request from pt's pharmacy for gabapentin.  Read notes from last visit per Dr Lindi Adie: "Continues to be present in spite of gabapentin.  Patient wants to stop taking gabapentin.  She will taper it and then discontinue.  If her hot flashes get worse then she will start taking it again."  Called pt to see if she needs refill or has started to wean herself per notes.  Pt reports she hasn't started to wean off gabapentin and verbalized she understands 'not to stop cold [place]'. Talk to her about how to wean as in not taking it as often or trying Tylenol in place of a dose every now and in order to slowly wean off.  Pt voiced understanding.  Pt said she would like another refill as scheduled as she may try to start weaning self but doesn't want to run out either.  I encouraged her to call our office if any questions/concerns.  No other needs per pt at this time.

## 2018-02-23 DIAGNOSIS — M7062 Trochanteric bursitis, left hip: Secondary | ICD-10-CM | POA: Diagnosis not present

## 2018-02-23 DIAGNOSIS — M25552 Pain in left hip: Secondary | ICD-10-CM | POA: Diagnosis not present

## 2018-02-23 DIAGNOSIS — M25551 Pain in right hip: Secondary | ICD-10-CM | POA: Diagnosis not present

## 2018-02-23 DIAGNOSIS — M7061 Trochanteric bursitis, right hip: Secondary | ICD-10-CM | POA: Diagnosis not present

## 2018-03-28 DIAGNOSIS — M25551 Pain in right hip: Secondary | ICD-10-CM | POA: Diagnosis not present

## 2018-03-28 DIAGNOSIS — M7062 Trochanteric bursitis, left hip: Secondary | ICD-10-CM | POA: Diagnosis not present

## 2018-03-28 DIAGNOSIS — M7061 Trochanteric bursitis, right hip: Secondary | ICD-10-CM | POA: Diagnosis not present

## 2018-04-05 DIAGNOSIS — L821 Other seborrheic keratosis: Secondary | ICD-10-CM | POA: Diagnosis not present

## 2018-04-05 DIAGNOSIS — D1801 Hemangioma of skin and subcutaneous tissue: Secondary | ICD-10-CM | POA: Diagnosis not present

## 2018-04-05 DIAGNOSIS — B078 Other viral warts: Secondary | ICD-10-CM | POA: Diagnosis not present

## 2018-04-05 DIAGNOSIS — B070 Plantar wart: Secondary | ICD-10-CM | POA: Diagnosis not present

## 2018-04-05 DIAGNOSIS — Z85828 Personal history of other malignant neoplasm of skin: Secondary | ICD-10-CM | POA: Diagnosis not present

## 2018-05-13 ENCOUNTER — Other Ambulatory Visit: Payer: Self-pay | Admitting: Hematology and Oncology

## 2018-05-13 DIAGNOSIS — R232 Flushing: Secondary | ICD-10-CM

## 2018-05-13 DIAGNOSIS — T451X5A Adverse effect of antineoplastic and immunosuppressive drugs, initial encounter: Principal | ICD-10-CM

## 2018-07-05 ENCOUNTER — Other Ambulatory Visit: Payer: Self-pay | Admitting: *Deleted

## 2018-07-05 DIAGNOSIS — C50411 Malignant neoplasm of upper-outer quadrant of right female breast: Secondary | ICD-10-CM

## 2018-07-05 DIAGNOSIS — Z170 Estrogen receptor positive status [ER+]: Principal | ICD-10-CM

## 2018-07-25 NOTE — Progress Notes (Signed)
Patient Care Team: Shirline Frees, MD as PCP - General (Family Medicine) Kem Boroughs, [place] as Nurse Practitioner (Family Medicine) Sylvan Cheese, NP as Nurse Practitioner (Hematology and Oncology)  DIAGNOSIS:    ICD-10-CM   1. Malignant neoplasm of upper-outer quadrant of right breast in female, estrogen receptor positive ([place]) C50.411    Z17.0     SUMMARY OF ONCOLOGIC HISTORY:   Breast cancer of upper-outer quadrant of right female breast ([place])   09/04/2015 Initial Diagnosis    Right breast biopsy invasive ductal carcinoma with papillary features, grade 1, ER/PR positive HER-2 negative Ki-67 3%; 6 month follow-up mamm: stable left breast calcifications but new right breast calcifications 2 sets 2 mm size (only one set biopsied)    10/27/2015 Surgery    Bilateral mastectomies [name]), right mastectomy: IDC grade 1, 2 foci, largest measures 1.8 cm, 2/6 lymph nodes positive T1cN1 stage II a, ER/PR positive HER-2 negative Ki-67 3%; left mastectomy: ALH.    10/27/2015 Oncotype testing    Mammaprint: Low risk, Luminal-type     12/09/2015 - 01/27/2016 Radiation Therapy    Adjuvant radiation therapy ([name]). Right chest wall, 50.4 Gy at 28 fractions.  Right chest/mastectomy scar boost, 10 Gy at 5 fractions     02/2016 -  Anti-estrogen oral therapy    Anastrozole 1 mg daily; switched to letrozole 2.5 mg daily 05/04/2016 due to myalgias and arthralgias    08/12/2016 Miscellaneous    ABC clinical trial (aspirin versus placebo)     CHIEF COMPLIANT: Follow-up on letrozole and ABC trial aspirin vs. placebo   INTERVAL HISTORY: Chelsea Patterson is a 63 y.o. with above-mentioned history of right breast cancer treated with bilateral mastectomies radiation and now on antiestrogen therapy with letrozole. She is a participant in the ABC clinical trial and was randomly assigned to receive aspirin as opposed to the placebo. Her last Chest XR on 07/26/17 showed emphysema but no other  changes.   She presents to the clinic alone today with a research nurse. She reports severe hot flashes, worse in the evening, which occur directly after taking her medication. She is open to switching medications from letrozole to exemestane as long as it will not cost more money. She reports bursitis in her hips, for which she had steroid shots in 06/2018, and is wondering is she can take Aleve for the pain in her hips and legs. She takes 8 Tylenol extra-strength pills per day when she needs it. Hip pain is relieved with exercise. She is switching to Medicare in a few months.   REVIEW OF SYSTEMS:   Constitutional: Denies fevers, chills or abnormal weight loss (+) severe hot flashes  Eyes: Denies blurriness of vision Ears, nose, mouth, throat, and face: Denies mucositis or sore throat Respiratory: Denies cough, dyspnea or wheezes Cardiovascular: Denies palpitation, chest discomfort Gastrointestinal:  Denies nausea, heartburn or change in bowel habits Skin: Denies abnormal skin rashes MSK: (+) bursitic in hips (+) myalgias in legs Lymphatics: Denies new lymphadenopathy or easy bruising Neurological:Denies numbness, tingling or new weaknesses Behavioral/Psych: Mood is stable, no new changes  Extremities: No lower extremity edema Breast: denies any pain or lumps or nodules  All other systems were reviewed with the patient and are negative.  I have reviewed the past medical history, past surgical history, social history and family history with the patient and they are unchanged from previous note.  ALLERGIES:  is allergic to wellbutrin [bupropion]; latex; and prozac [fluoxetine hcl].  MEDICATIONS:  Current Outpatient  Medications  Medication Sig Dispense Refill  . acetaminophen (TYLENOL ARTHRITIS PAIN) 650 MG CR tablet Take 2,600 mg by mouth every 8 (eight) hours as needed for pain. Reported on 12/22/2015    . acyclovir (ZOVIRAX) 400 MG tablet acyclovir 400 mg tablet  TAKE 1 TABLET BY MOUTH  TWICE A DAY    . atorvastatin (LIPITOR) 40 MG tablet Take 40 mg by mouth daily at 6 PM.     . b complex vitamins tablet Take 1 tablet by mouth daily.    . cholecalciferol (VITAMIN D) 1000 UNITS tablet Take by mouth daily. Takes between 2-4    . Collagen-Vitamin C (COLLAGEN PLUS VITAMIN C PO) Take 1 tablet by mouth daily. Reported on 12/22/2015    . diphenhydramine-acetaminophen (TYLENOL PM) 25-500 MG TABS tablet Take 1 tablet by mouth at bedtime.     Marland Kitchen escitalopram (LEXAPRO) 20 MG tablet Take 20 mg by mouth daily.     . fish oil-omega-3 fatty acids 1000 MG capsule Take 2 g by mouth daily.    Marland Kitchen gabapentin (NEURONTIN) 300 MG capsule TAKE 1 CAPSULE (300 MG TOTAL) BY MOUTH AT BEDTIME. 30 capsule 2  . Investigational aspirin/placebo 300 MG tablet [facility] [id number] Take 1 tablet by mouth daily. Take with food or a full glass of water.  Do not crush enteric coated tablets. 180 tablet 0  . Investigational aspirin/placebo 300 MG tablet [facility] [id number] Take 1 tablet by mouth daily. Take with food or a full glass of water.  Do not crush enteric coated tablets. 180 tablet 0  . letrozole (FEMARA) 2.5 MG tablet Take 1 tablet (2.5 mg total) by mouth daily. 90 tablet 3  . Multiple Vitamin (MULTIVITAMIN) tablet Take 1 tablet by mouth daily.    Vladimir Faster Glycol-Propyl Glycol (SYSTANE OP) Place 1-2 drops into both eyes as needed (for dry eyes).    Marland Kitchen tiotropium (SPIRIVA) 18 MCG inhalation capsule Place 18 mcg into inhaler and inhale daily.    . varenicline (CHANTIX) 1 MG tablet Chantix 1 mg tablet  TAKE 1 TABLET BY MOUTH TWICE A DAY    . XOPENEX HFA 45 MCG/ACT inhaler Inhale 2 puffs into the lungs every 4 (four) hours as needed for wheezing or shortness of breath.      No current facility-administered medications for this visit.     PHYSICAL EXAMINATION: ECOG PERFORMANCE STATUS: 1 - Symptomatic but completely ambulatory  Vitals:   07/26/18 1030  BP: 119/75  Pulse: 94  Resp: 16  Temp: 98.3 F (36.8 C)    SpO2: 99%   Filed Weights   07/26/18 1030  Weight: 128 lb 3.2 oz (58.2 kg)    GENERAL:alert, no distress and comfortable SKIN: skin color, texture, turgor are normal, no rashes or significant lesions EYES: normal, Conjunctiva are pink and non-injected, sclera clear OROPHARYNX:no exudate, no erythema and lips, buccal mucosa, and tongue normal  NECK: supple, thyroid normal size, non-tender, without nodularity LYMPH:  no palpable lymphadenopathy in the cervical, axillary or inguinal LUNGS: Emphysema HEART: regular rate & rhythm and no murmurs and no lower extremity edema ABDOMEN:abdomen soft, non-tender and normal bowel sounds MUSCULOSKELETAL:no cyanosis of digits and no clubbing  NEURO: alert & oriented x 3 with fluent speech, no focal motor/sensory deficits EXTREMITIES: No lower extremity edema  LABORATORY DATA:  I have reviewed the data as listed CMP Latest Ref Rng & Units 10/27/2015 10/27/2015 10/08/2015  Glucose 65 - 99 mg/dL - 96 89  BUN 6 - 20 mg/dL - 11 11  Creatinine 0.44 - 1.00 mg/dL 0.78 0.84 0.87  Sodium 135 - 145 mmol/L - 144 141  Potassium 3.5 - 5.1 mmol/L - 4.6 4.8  Chloride 101 - 111 mmol/L - 109 106  CO2 22 - 32 mmol/L - 24 28  Calcium 8.9 - 10.3 mg/dL - 9.4 9.6  Total Protein 6.5 - 8.1 g/dL - 6.5 6.7  Total Bilirubin 0.3 - 1.2 mg/dL - 0.5 0.5  Alkaline Phos 38 - 126 U/L - 54 59  AST 15 - 41 U/L - 23 34  ALT 14 - 54 U/L - 16 38    Lab Results  Component Value Date   WBC 14.2 (H) 10/27/2015   HGB 11.2 (L) 10/27/2015   HCT 33.7 (L) 10/27/2015   MCV 96.3 10/27/2015   PLT 249 10/27/2015   NEUTROABS 4.2 10/27/2015    ASSESSMENT & PLAN:  Breast cancer of upper-outer quadrant of right female breast ([place]) Bilateral mastectomies 10/27/2015: Right mastectomy: IDC grade 1, 2 foci, largest measures 1.8 cm, 2/6 lymph nodes positive T1cN1 stage II a, ER/PR positive HER-2 negative Ki-67 3%; left mastectomy: ALH; Mammaprint low risk, luminal type A (did not need  chemotherapy) Adjuvant radiation therapy from 12/09/2015 to 01/27/2016   Current treatment: Adjuvant antiestrogen therapy with anastrozole 1 mg by mouth daily switched to Letrozole 05/04/16 due to musculoskeletal Sx  Letrozole toxicities: 1. Hot flashes: These happen between 7-10 PM every day in spite of gabapentin 2. skin dryness 3. Tendinitis left wrist and now bursitis in bilateral hips getting steroid injections: Currently taking 4 to 8 tablets of Tylenol per day  I instructed her to stop letrozole for 2 weeks and see if the symptoms get better.  If they do get better she will call us and we will send a prescription for exemestane. If the exemestane is reasonably priced for her through insurance then she will continue with that.   ABC clinical trial: Grade 1 bruises, denies any GI upset/ Bleeds  Breast cancer surveillance: 1.  Breast exam July 2019: Benign 2.  Mammogram not necessary since she had bilateral mastectomies. Return to clinic in 7month for follow-up    No orders of the defined types were placed in this encounter.  The patient has a good understanding of the overall plan. she agrees with it. she will call with any problems that may develop before the next visit here.  GNicholas Lose MD 07/26/2018   I, Molly Dorshimer, am acting as scribe for VNicholas Lose MD.  I have reviewed the above documentation for accuracy and completeness, and I agree with the above.

## 2018-07-26 ENCOUNTER — Inpatient Hospital Stay: Payer: 59

## 2018-07-26 ENCOUNTER — Telehealth: Payer: Self-pay | Admitting: Hematology and Oncology

## 2018-07-26 ENCOUNTER — Inpatient Hospital Stay: Payer: 59 | Attending: Hematology and Oncology | Admitting: Hematology and Oncology

## 2018-07-26 ENCOUNTER — Encounter: Payer: Self-pay | Admitting: Hematology and Oncology

## 2018-07-26 ENCOUNTER — Other Ambulatory Visit: Payer: 59

## 2018-07-26 ENCOUNTER — Encounter: Payer: Self-pay | Admitting: *Deleted

## 2018-07-26 DIAGNOSIS — Z923 Personal history of irradiation: Secondary | ICD-10-CM

## 2018-07-26 DIAGNOSIS — Z79811 Long term (current) use of aromatase inhibitors: Secondary | ICD-10-CM | POA: Insufficient documentation

## 2018-07-26 DIAGNOSIS — Z7982 Long term (current) use of aspirin: Secondary | ICD-10-CM | POA: Insufficient documentation

## 2018-07-26 DIAGNOSIS — C50411 Malignant neoplasm of upper-outer quadrant of right female breast: Secondary | ICD-10-CM

## 2018-07-26 DIAGNOSIS — Z006 Encounter for examination for normal comparison and control in clinical research program: Secondary | ICD-10-CM | POA: Insufficient documentation

## 2018-07-26 DIAGNOSIS — Z170 Estrogen receptor positive status [ER+]: Secondary | ICD-10-CM

## 2018-07-26 DIAGNOSIS — Z9013 Acquired absence of bilateral breasts and nipples: Secondary | ICD-10-CM

## 2018-07-26 DIAGNOSIS — Z79899 Other long term (current) drug therapy: Secondary | ICD-10-CM | POA: Diagnosis not present

## 2018-07-26 NOTE — Assessment & Plan Note (Addendum)
Bilateral mastectomies 10/27/2015: Right mastectomy: IDC grade 1, 2 foci, largest measures 1.8 cm, 2/6 lymph nodes positive T1cN1 stage II a, ER/PR positive HER-2 negative Ki-67 3%; left mastectomy: ALH; Mammaprint low risk, luminal type A (did not need chemotherapy) Adjuvant radiation therapy from 12/09/2015 to 01/27/2016   Current treatment: Adjuvant antiestrogen therapy with anastrozole 1 mg by mouth daily switched to Letrozole 05/04/16 due to musculoskeletal Sx  Letrozole toxicities: 1. Hot flashes: These happen between 7-10 PM every day in spite of gabapentin 2. skin dryness 3. Tendinitis left wrist and now bursitis in bilateral hips getting steroid injections I instructed her to stop letrozole for 2 weeks and see if the symptoms get better.  If they do get better she will call us and we will send a prescription for exemestane. If the exemestane is reasonably priced for her through insurance then she will continue with that.   ABC clinical trial: Grade 1 bruises, denies any GI upset/ Bleeds  Breast cancer surveillance: 1.  Breast exam July 2019: Benign 2.  Mammogram not necessary since she had bilateral mastectomies. Return to clinic in 77month for follow-up

## 2018-07-26 NOTE — Telephone Encounter (Signed)
Gave avs and calendar ° °

## 2018-07-26 NOTE — Progress Notes (Signed)
07/26/2018 at 11:30am -[facility] [phone] - month 24 study notes-Patient into the cancer center this morning for her 66month on-study visit. The pt was given the patient questionnaire booklet to complete upon arrival to the cancer center.   The pt completed the booklet.   The pt then went to the lab to have her month 24 research blood ([facility] whole blood) drawn and to provide her urine sample for the month 24 spot urine  The pt forgot to return her completed medication logs forJuly 16th 2019 through [facility].She said that she forgot to bring them into the clinic today.  She says she has them on the side of her refrigerator.  The pt was given a self-addressed envelope, and she was told to mail all of her medication logs to the research nurse as soon as possible.  The pt said that she would put them in the mail tomorrow.  The pt confirmed that shedid not take her study drug today.The pt states she takes her study drug in the evening.The pt returned 2 bottles of study drug. One bottle was empty, and the other bottle had2remaining pills inside. The pill bottles were taken to the pharmacy and given Chapman Fitch, pharmacist, for the drug accountability check.  She reports ongoinghot flashesmainly in the evenings.She denies any medication changes from her last office visit.  The pt also complained of ongoing bilateral hip bursitis.  The pt said that she has had several injections for symptom relief in the past.   Dr. Lindi Adie stated that he feels that her orthopedic problems are not related to her ABC study drug. He said her hot flashes and bursitiswere probably related to her letrozole. Dr. Lindi Adie advised the pt to stop her letrozole for 2 weeks to see if her joint pain and hot flashes lessen in severity.  The pt was in agreement to stop her letrozole and contact Dr. Lindi Adie in 2 weeks about the status of her bursitis and hot flashes.  The research nurse and Dr. Lindi Adie reviewed the "Solicited AE  checklist" with the pt. She specifically denied the following AE's: gastrointestinal bleeding, intracranial bleeding, nosebleeds (epistaxis), blood in her urine (hematuria), indigestion/heartburn (dyspepsia), and gastritis. The pt did report some localized bruising onoccasion when she "bumps into things". Dr. Lindi Adie felt the pt had grade 1 bruising, and he felt that it was "probably" related to her study drug. The pt was asked if she has used any "non-protocol use of aspirin or other NSAIDs during this reporting period". The pt answered "no". The pt said that she only uses Tylenol.Since her bursitis is ongoing, she inquired about taking Alleve or ibuprofen for additional relief of her symptoms.  Dr. Lindi Adie referred to the protocol about "non-protocol use of aspirin and/or NSAIDS".  He told the pt that the protocol discourages taking these medications, but the pt will not be removed from the study for taking these medications.  The pt was told that she would have to alert the study staff if she uses aspirin and NSAIDS.  The pt was encouraged to record all of her doses of aspirin and NSAIDS on her medication diaries under the "comment" section.  The pt verbalized understanding.  The pt was given medication logs starting today through July2020. The pt was given her month 30appointment for 01/24/19. The pt was told that she will have enough study drug dispensed today to last until her next appointment. The pt was told that the new bottle has 200 pills, and the pt will  probably have more pills to return.  The pt was told that if her bruising were to worsen or if she developed any new symptoms to call Dr. Lindi Adie.The pt said that she was comfortable continuing her participation in the study.The pt was thanked for her support and compliance on the study. One new bottle of study drug (aspirin 300 mg or placebo) was dispensed by pharmacist, Raul Del. The pt was given the new study drug bottle and  instructed to continue taking 1 pill daily. Brion Aliment RN, BSN, CCRP Clinical Research Nurse 07/26/2018 11:44 AM   07/26/2018- at 3:53pm- The pt's research whole blood and spot urine was shipped today.  The research nurse entered the pt's data into [facility].  The research nurse noted that the pt overlooked page 3 of the patient questionnaire booklet.  The research nurse called the pt and obtained her answers over the phone.  The patient also did not mark an answer for question #7 on page 5 of the booklet.  However, the pt wrote that she had taken medicine for sleep "daily".  Therefore, the nurse marked "3 or more times a week" since this equates to taking daily.  The pt also did not complete the fatigue/uniscale assessment on page 9 of the booklet.  The pt provided answers to these 2 questions, but there was no place in [facility] to enter this data.   Brion Aliment RN, BSN, [facility] Clinical Research Nurse 07/26/2018 4:01 PM    07/31/2018 at 11:49am- The research nurse received the pt's completed medication diaries from July 2019 through January 2020. Medication logsindicate no missed doses during the pt's date range of 01/30/18 through 07/25/2018, and the pill count confirmed a calculated compliance of 100%. Brion Aliment RN, BSN, CCRP Clinical Research Nurse 07/31/2018 12:02 PM

## 2018-07-27 ENCOUNTER — Ambulatory Visit: Payer: 59 | Admitting: Hematology and Oncology

## 2018-07-27 ENCOUNTER — Other Ambulatory Visit: Payer: 59

## 2018-07-27 LAB — RESEARCH LABS

## 2018-08-10 ENCOUNTER — Other Ambulatory Visit: Payer: Self-pay

## 2018-08-10 ENCOUNTER — Other Ambulatory Visit: Payer: Self-pay | Admitting: Hematology and Oncology

## 2018-08-10 DIAGNOSIS — R232 Flushing: Secondary | ICD-10-CM

## 2018-08-10 DIAGNOSIS — T451X5A Adverse effect of antineoplastic and immunosuppressive drugs, initial encounter: Principal | ICD-10-CM

## 2018-08-10 MED ORDER — EXEMESTANE 25 MG PO TABS: 25.0000 mg | ORAL_TABLET | Freq: Every day | ORAL | 0 refills | Status: DC

## 2018-08-10 NOTE — Progress Notes (Signed)
Pt called to report that she had been off letrozole x2 weeks and all her symptoms resolved. Pt would like to try exemestane and see if this will help. Verified pharmacy and will send exemestane. Discussed possible side effects of exemestane and could be similar to previous AE medication. Pt will try it for 30 days and report to MD on how she is doing.

## 2018-08-14 DIAGNOSIS — Z23 Encounter for immunization: Secondary | ICD-10-CM | POA: Diagnosis not present

## 2018-08-14 DIAGNOSIS — M791 Myalgia, unspecified site: Secondary | ICD-10-CM | POA: Diagnosis not present

## 2018-08-14 DIAGNOSIS — E7800 Pure hypercholesterolemia, unspecified: Secondary | ICD-10-CM | POA: Diagnosis not present

## 2018-08-14 DIAGNOSIS — M542 Cervicalgia: Secondary | ICD-10-CM | POA: Diagnosis not present

## 2018-09-06 ENCOUNTER — Other Ambulatory Visit: Payer: Self-pay | Admitting: Hematology and Oncology

## 2018-09-06 DIAGNOSIS — T451X5A Adverse effect of antineoplastic and immunosuppressive drugs, initial encounter: Principal | ICD-10-CM

## 2018-09-06 DIAGNOSIS — R232 Flushing: Secondary | ICD-10-CM

## 2018-10-05 ENCOUNTER — Other Ambulatory Visit: Payer: Self-pay | Admitting: Hematology and Oncology

## 2018-10-05 DIAGNOSIS — T451X5A Adverse effect of antineoplastic and immunosuppressive drugs, initial encounter: Principal | ICD-10-CM

## 2018-10-05 DIAGNOSIS — R232 Flushing: Secondary | ICD-10-CM

## 2018-10-10 ENCOUNTER — Other Ambulatory Visit: Payer: Self-pay | Admitting: *Deleted

## 2018-10-10 DIAGNOSIS — R232 Flushing: Secondary | ICD-10-CM

## 2018-10-10 DIAGNOSIS — T451X5A Adverse effect of antineoplastic and immunosuppressive drugs, initial encounter: Principal | ICD-10-CM

## 2018-10-10 MED ORDER — GABAPENTIN 300 MG PO CAPS: ORAL_CAPSULE | ORAL | 1 refills | Status: DC

## 2018-10-10 MED ORDER — EXEMESTANE 25 MG PO TABS: 25.0000 mg | ORAL_TABLET | Freq: Every day | ORAL | 9 refills | Status: DC

## 2018-10-31 ENCOUNTER — Other Ambulatory Visit: Payer: Self-pay | Admitting: Hematology and Oncology

## 2018-10-31 DIAGNOSIS — R232 Flushing: Secondary | ICD-10-CM

## 2018-10-31 DIAGNOSIS — T451X5A Adverse effect of antineoplastic and immunosuppressive drugs, initial encounter: Principal | ICD-10-CM

## 2018-12-11 DIAGNOSIS — G5620 Lesion of ulnar nerve, unspecified upper limb: Secondary | ICD-10-CM | POA: Insufficient documentation

## 2018-12-25 ENCOUNTER — Telehealth: Payer: Self-pay | Admitting: Certified Nurse Midwife

## 2018-12-25 NOTE — Telephone Encounter (Signed)
Left message on voicemail to call and reschedule cancelled appointment. °

## 2019-01-15 MED ORDER — INV-ASPIRIN/PLACEBO 300 MG TABS ALLIANCE A011502: 1.0000 | ORAL_TABLET | Freq: Every day | ORAL | 0 refills | Status: DC

## 2019-01-17 ENCOUNTER — Telehealth: Payer: Self-pay | Admitting: Hematology and Oncology

## 2019-01-17 NOTE — Telephone Encounter (Signed)
Research patient can not be virtual

## 2019-01-17 NOTE — Assessment & Plan Note (Signed)
Bilateral mastectomies 10/27/2015: Right mastectomy: IDC grade 1, 2 foci, largest measures 1.8 cm, 2/6 lymph nodes positive T1cN1 stage II a, ER/PR positive HER-2 negative Ki-67 3%; left mastectomy: ALH; Mammaprint low risk, luminal type A (did not need chemotherapy) Adjuvant radiation therapy from 12/09/2015 to 01/27/2016   Current treatment: Adjuvant antiestrogen therapy with anastrozole 1 mg by mouth daily switched to Letrozole 05/04/16 due to musculoskeletal Sx  Letrozole toxicities: 1. Hot flashes: These happen between 7-10 PM every day in spite of gabapentin 2. skin dryness 3. Tendinitis left wristand now bursitis in bilateral hips getting steroid injections: Currently taking 4 to 8 tablets of Tylenol per day  I instructed her to stop letrozole for 2 weeks and see if the symptoms get better.  If they do get better she will call us and we will send a prescription for exemestane. If the exemestane is reasonably priced for her through insurance then she will continue with that.  ABC clinical trial:Grade 1 bruises, denies any GI upset/ Bleeds  Breast cancer surveillance: 1.  Breast exam July 2019: Benign 2.  Mammogram not necessary since she had bilateral mastectomies. Return to clinic in 16month for follow-up

## 2019-01-23 NOTE — Progress Notes (Signed)
Patient Care Team: Shirline Frees, MD as PCP - General (Family Medicine) Kem Boroughs, [place] as Nurse Practitioner (Family Medicine) Sylvan Cheese, NP as Nurse Practitioner (Hematology and Oncology)  DIAGNOSIS:    ICD-10-CM   1. Malignant neoplasm of upper-outer quadrant of right breast in female, estrogen receptor positive ([place])  C50.411    Z17.0     SUMMARY OF ONCOLOGIC HISTORY: Oncology History  Breast cancer of upper-outer quadrant of right female breast ([place])  09/04/2015 Initial Diagnosis   Right breast biopsy invasive ductal carcinoma with papillary features, grade 1, ER/PR positive HER-2 negative Ki-67 3%; 6 month follow-up mamm: stable left breast calcifications but new right breast calcifications 2 sets 2 mm size (only one set biopsied)   10/27/2015 Surgery   Bilateral mastectomies [name]), right mastectomy: IDC grade 1, 2 foci, largest measures 1.8 cm, 2/6 lymph nodes positive T1cN1 stage II a, ER/PR positive HER-2 negative Ki-67 3%; left mastectomy: ALH.   10/27/2015 Oncotype testing   Mammaprint: Low risk, Luminal-type    12/09/2015 - 01/27/2016 Radiation Therapy   Adjuvant radiation therapy ([name]). Right chest wall, 50.4 Gy at 28 fractions.  Right chest/mastectomy scar boost, 10 Gy at 5 fractions    02/2016 -  Anti-estrogen oral therapy   Anastrozole 1 mg daily; switched to letrozole 2.5 mg daily 05/04/2016 due to myalgias and arthralgias; switched to exemestane 08/10/18 due to severe hot flashes   08/12/2016 Miscellaneous   ABC clinical trial (aspirin versus placebo)     CHIEF COMPLIANT: Follow-up on exemestane and ABC trial aspirin vs. placebo   INTERVAL HISTORY: Chelsea Patterson is a 63 y.o. with above-mentioned history of right breast cancer treated with bilateral mastectomies, radiation, and is now on antiestrogen therapy with letrozole. She is a participant in the ABC clinical trial and was randomly assigned to receive aspirin as opposed to the  placebo. She had severe hot flashes on letrozole and went off for 2 weeks with complete improvement in symptoms. She was switched to exemestane on 08/10/18. She presents to the clinic today for follow-up.   REVIEW OF SYSTEMS:   Constitutional: Denies fevers, chills or abnormal weight loss Eyes: Denies blurriness of vision Ears, nose, mouth, throat, and face: Denies mucositis or sore throat Respiratory: Denies cough, dyspnea or wheezes Cardiovascular: Denies palpitation, chest discomfort Gastrointestinal: Denies nausea, heartburn or change in bowel habits Skin: Denies abnormal skin rashes Lymphatics: Denies new lymphadenopathy or easy bruising Neurological: Denies numbness, tingling or new weaknesses Behavioral/Psych: Mood is stable, no new changes  Extremities: No lower extremity edema Breast: denies any pain or lumps or nodules in either breasts All other systems were reviewed with the patient and are negative.  I have reviewed the past medical history, past surgical history, social history and family history with the patient and they are unchanged from previous note.  ALLERGIES:  is allergic to wellbutrin [bupropion]; latex; and prozac [fluoxetine hcl].  MEDICATIONS:  Current Outpatient Medications  Medication Sig Dispense Refill  . acetaminophen (TYLENOL ARTHRITIS PAIN) 650 MG CR tablet Take 2,600 mg by mouth every 8 (eight) hours as needed for pain. Reported on 12/22/2015    . acyclovir (ZOVIRAX) 400 MG tablet acyclovir 400 mg tablet  TAKE 1 TABLET BY MOUTH TWICE A DAY    . atorvastatin (LIPITOR) 40 MG tablet Take 40 mg by mouth daily at 6 PM.     . b complex vitamins tablet Take 1 tablet by mouth daily.    . cholecalciferol (VITAMIN D) 1000 UNITS  tablet Take by mouth daily. Takes between 2-4    . Collagen-Vitamin C (COLLAGEN PLUS VITAMIN C PO) Take 1 tablet by mouth daily. Reported on 12/22/2015    . diphenhydramine-acetaminophen (TYLENOL PM) 25-500 MG TABS tablet Take 1 tablet by  mouth at bedtime.     Marland Kitchen escitalopram (LEXAPRO) 20 MG tablet Take 20 mg by mouth daily.     Marland Kitchen exemestane (AROMASIN) 25 MG tablet Take 1 tablet (25 mg total) by mouth daily after breakfast. 30 tablet 9  . fish oil-omega-3 fatty acids 1000 MG capsule Take 2 g by mouth daily.    Marland Kitchen gabapentin (NEURONTIN) 300 MG capsule TAKE 1 CAPSULE BY MOUTH EVERYDAY AT BEDTIME 30 capsule 0  . Investigational aspirin/placebo 300 MG tablet Alliance [id number] Take 1 tablet by mouth daily. Take with food or a full glass of water.  Do not crush enteric coated tablets. 180 tablet 0  . Investigational aspirin/placebo 300 MG tablet Alliance [id number] Take 1 tablet by mouth daily. Take with food or a full glass of water.  Do not crush enteric coated tablets. 180 tablet 0  . Investigational aspirin/placebo 300 MG tablet Alliance [id number] Take 1 tablet by mouth daily. Take with food or a full glass of water.  Do not crush enteric coated tablets. 200 tablet 0  . letrozole (FEMARA) 2.5 MG tablet Take 1 tablet (2.5 mg total) by mouth daily. 90 tablet 3  . Multiple Vitamin (MULTIVITAMIN) tablet Take 1 tablet by mouth daily.    Vladimir Faster Glycol-Propyl Glycol (SYSTANE OP) Place 1-2 drops into both eyes as needed (for dry eyes).    Marland Kitchen tiotropium (SPIRIVA) 18 MCG inhalation capsule Place 18 mcg into inhaler and inhale daily.    . varenicline (CHANTIX) 1 MG tablet Chantix 1 mg tablet  TAKE 1 TABLET BY MOUTH TWICE A DAY    . XOPENEX HFA 45 MCG/ACT inhaler Inhale 2 puffs into the lungs every 4 (four) hours as needed for wheezing or shortness of breath.      No current facility-administered medications for this visit.     PHYSICAL EXAMINATION: ECOG PERFORMANCE STATUS: 1 - Symptomatic but completely ambulatory  Vitals:   01/24/19 1141  BP: 108/67  Pulse: 95  Resp: 17  Temp: 98.9 F (37.2 C)  SpO2: 100%   Filed Weights   01/24/19 1141  Weight: 132 lb 5 oz (60 kg)    No clinical signs or symptoms of disease recurrence LABORATORY  DATA:  I have reviewed the data as listed CMP Latest Ref Rng & Units 10/27/2015 10/27/2015 10/08/2015  Glucose 65 - 99 mg/dL - 96 89  BUN 6 - 20 mg/dL - 11 11  Creatinine 0.44 - 1.00 mg/dL 0.78 0.84 0.87  Sodium 135 - 145 mmol/L - 144 141  Potassium 3.5 - 5.1 mmol/L - 4.6 4.8  Chloride 101 - 111 mmol/L - 109 106  CO2 22 - 32 mmol/L - 24 28  Calcium 8.9 - 10.3 mg/dL - 9.4 9.6  Total Protein 6.5 - 8.1 g/dL - 6.5 6.7  Total Bilirubin 0.3 - 1.2 mg/dL - 0.5 0.5  Alkaline Phos 38 - 126 U/L - 54 59  AST 15 - 41 U/L - 23 34  ALT 14 - 54 U/L - 16 38    Lab Results  Component Value Date   WBC 14.2 (H) 10/27/2015   HGB 11.2 (L) 10/27/2015   HCT 33.7 (L) 10/27/2015   MCV 96.3 10/27/2015   PLT 249 10/27/2015   NEUTROABS  4.2 10/27/2015    ASSESSMENT & PLAN:  Breast cancer of upper-outer quadrant of right female breast ([place]) Bilateral mastectomies 10/27/2015: Right mastectomy: IDC grade 1, 2 foci, largest measures 1.8 cm, 2/6 lymph nodes positive T1cN1 stage II a, ER/PR positive HER-2 negative Ki-67 3%; left mastectomy: ALH; Mammaprint low risk, luminal type A (did not need chemotherapy) Adjuvant radiation therapy from 12/09/2015 to 01/27/2016   Current treatment: Adjuvant antiestrogen therapy with anastrozole 1 mg by mouth daily switched to Letrozole 05/04/16 due to musculoskeletal Sx, switched to exemestane January 2020.  But her insurance is not covering it so we are switching her back to anastrozole 1 mg daily 01/24/2019  Prior toxicities to anastrozole and letrozole: Hot flashes, skin dryness, myalgias Patient wants to take magnesium for the muscle aches and pains.  ABC clinical trial:Grade 1 bruises, denies any GI upset/ Bleeds  Breast cancer surveillance: 1.  Breast exam July 2019: Benign 2.  Mammogram not necessary since she had bilateral mastectomies. Return to clinic in 19month for follow-up    No orders of the defined types were placed in this encounter.  The patient has  a good understanding of the overall plan. she agrees with it. she will call with any problems that may develop before the next visit here.  GNicholas Lose MD 01/24/2019  IJulious OkaDorshimer am acting as scribe for Dr. VNicholas Lose  I have reviewed the above documentation for accuracy and completeness, and I agree with the above.

## 2019-01-24 ENCOUNTER — Encounter: Payer: Self-pay | Admitting: Medical Oncology

## 2019-01-24 ENCOUNTER — Other Ambulatory Visit: Payer: Self-pay

## 2019-01-24 ENCOUNTER — Inpatient Hospital Stay: Payer: Medicare Other | Attending: Hematology and Oncology | Admitting: Hematology and Oncology

## 2019-01-24 DIAGNOSIS — C50411 Malignant neoplasm of upper-outer quadrant of right female breast: Secondary | ICD-10-CM

## 2019-01-24 DIAGNOSIS — Z9013 Acquired absence of bilateral breasts and nipples: Secondary | ICD-10-CM | POA: Insufficient documentation

## 2019-01-24 DIAGNOSIS — Z170 Estrogen receptor positive status [ER+]: Secondary | ICD-10-CM

## 2019-01-24 DIAGNOSIS — Z923 Personal history of irradiation: Secondary | ICD-10-CM | POA: Insufficient documentation

## 2019-01-24 DIAGNOSIS — Z006 Encounter for examination for normal comparison and control in clinical research program: Secondary | ICD-10-CM | POA: Insufficient documentation

## 2019-01-24 DIAGNOSIS — Z79811 Long term (current) use of aromatase inhibitors: Secondary | ICD-10-CM | POA: Insufficient documentation

## 2019-01-24 MED ORDER — INV-ASPIRIN/PLACEBO 300 MG TABS ALLIANCE A011502: 1.0000 | ORAL_TABLET | Freq: Every day | ORAL | 0 refills | Status: DC

## 2019-01-24 MED ORDER — ANASTROZOLE 1 MG PO TABS: 1.0000 mg | ORAL_TABLET | Freq: Every day | ORAL | 3 refills | Status: DC

## 2019-01-24 NOTE — Progress Notes (Signed)
ABC: Month 30 study visit Patient presented to clinic today for her month 30 on study visit for ABC. I met with patient, who is here alone, in exam room after patient's vital signs obtained. Patient provided me with her medication diaries from the prior 6 months, as well as her previously dispensed study drug aspirin/placebo. Dr. Lindi Adie met with patient and addressed patient's questions/concerns. H&P: Completed by Dr. Lindi Adie today in clinic. Vitas, with weight were obtained. Solicited AE's: Patient denies specifically having any GI bleeding, intracrannial hemorrhage, epistaxis, hematuria, dyspepsia and gastritis. Patient does confirm having continued localized bruising (gr 1), with no worsening since previous reporting.  Other Adverse Events: Patient denies having any other adverse events today. Non-protocol use of aspririn or other NSAIDs during this reporting period: Patient has documented use of Advil on her medication diaries.  Study Medication Diaries: Patient returned medication diaries reflecting months January through July of 2020, with start date of January 10th, 2020 and end date of July 8th, 2020 (per patient she takes med in the evening). Patient reports no missed doses. Patient has note of stopping for a scheduled colonoscopy on March 24th, 2020, but this procedure was postponed, so per patient has no missed days. Patient was dispensed medication diaries, for documentation starting today through the next 6 months (January 2021), patient knows to document daily self administration.  Study Drug Aspirin/Placebo: Patient returned previously dispensed study medication bottle ([id number]) with 22 tablets of aspirin/placebo remaining, count and documentation of return verified by PharmD Kennith Center. [place] also dispensed patient's next six months of study drug aspirin/placebo 300 mg, 1 bottle containing 200 tablets (Rx [id number]) in which I provided to the patient and instructed her to continue taking 1 pill  daily.  Plan:Patient will be scheduled to return in 6 months time for her 36 month on study visit. All patient's questions answered to her satisfaction. Patient thanked for her continued support of study and was encouraged to call Dr. Lindi Adie or Lexine Baton with any questions or concerns.  Maxwell Marion, RN, BSN, [facility] Clinical Research 01/24/2019 1:26 PM

## 2019-01-28 ENCOUNTER — Ambulatory Visit (INDEPENDENT_AMBULATORY_CARE_PROVIDER_SITE_OTHER): Payer: Medicare Other | Admitting: Certified Nurse Midwife

## 2019-01-28 ENCOUNTER — Other Ambulatory Visit: Payer: Self-pay

## 2019-01-28 ENCOUNTER — Encounter: Payer: Self-pay | Admitting: Certified Nurse Midwife

## 2019-01-28 ENCOUNTER — Other Ambulatory Visit (HOSPITAL_COMMUNITY)
Admission: RE | Admit: 2019-01-28 | Discharge: 2019-01-28 | Disposition: A | Discharge status: Home / Self Care | Payer: Medicare Other | Source: Ambulatory Visit | Attending: Certified Nurse Midwife | Admitting: Certified Nurse Midwife

## 2019-01-28 VITALS — BP 104/64 | HR 72 | Temp 97.2°F | Resp 16 | Ht 66.75 in | Wt 133.0 lb

## 2019-01-28 DIAGNOSIS — Z124 Encounter for screening for malignant neoplasm of cervix: Secondary | ICD-10-CM

## 2019-01-28 DIAGNOSIS — Z1151 Encounter for screening for human papillomavirus (HPV): Secondary | ICD-10-CM | POA: Diagnosis not present

## 2019-01-28 DIAGNOSIS — Z780 Asymptomatic menopausal state: Secondary | ICD-10-CM | POA: Insufficient documentation

## 2019-01-28 DIAGNOSIS — Z01419 Encounter for gynecological examination (general) (routine) without abnormal findings: Secondary | ICD-10-CM | POA: Diagnosis not present
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
## 2019-01-28 NOTE — Patient Instructions (Signed)

## 2019-01-30 LAB — CYTOLOGY - PAP
Diagnosis: NEGATIVE
HPV: NOT DETECTED

## 2019-01-31 ENCOUNTER — Ambulatory Visit: Payer: 59 | Admitting: Certified Nurse Midwife

## 2019-02-12 ENCOUNTER — Other Ambulatory Visit: Payer: Self-pay

## 2019-02-12 MED ORDER — ANASTROZOLE 1 MG PO TABS: 1.0000 mg | ORAL_TABLET | Freq: Every day | ORAL | 3 refills | Status: DC

## 2019-04-08 ENCOUNTER — Other Ambulatory Visit: Payer: Self-pay | Admitting: Hematology and Oncology

## 2019-04-08 DIAGNOSIS — R232 Flushing: Secondary | ICD-10-CM

## 2019-07-28 NOTE — Progress Notes (Signed)
Patient Care Team: Shirline Frees, MD as PCP - General (Family Medicine) Kem Boroughs, [place] as Nurse Practitioner (Family Medicine) Sylvan Cheese, NP as Nurse Practitioner (Hematology and Oncology)  DIAGNOSIS:    ICD-10-CM   1. Malignant neoplasm of upper-outer quadrant of right breast in female, estrogen receptor positive ([place])  C50.411    Z17.0     SUMMARY OF ONCOLOGIC HISTORY: Oncology History  Breast cancer of upper-outer quadrant of right female breast ([place])  09/04/2015 Initial Diagnosis   Right breast biopsy invasive ductal carcinoma with papillary features, grade 1, ER/PR positive HER-2 negative Ki-67 3%; 6 month follow-up mamm: stable left breast calcifications but new right breast calcifications 2 sets 2 mm size (only one set biopsied)   10/27/2015 Surgery   Bilateral mastectomies [name]), right mastectomy: IDC grade 1, 2 foci, largest measures 1.8 cm, 2/6 lymph nodes positive T1cN1 stage II a, ER/PR positive HER-2 negative Ki-67 3%; left mastectomy: ALH.   10/27/2015 Oncotype testing   Mammaprint: Low risk, Luminal-type    12/09/2015 - 01/27/2016 Radiation Therapy   Adjuvant radiation therapy ([name]). Right chest wall, 50.4 Gy at 28 fractions.  Right chest/mastectomy scar boost, 10 Gy at 5 fractions    02/2016 -  Anti-estrogen oral therapy   Anastrozole 1 mg daily; switched to letrozole 2.5 mg daily 05/04/2016 due to myalgias and arthralgias; switched to exemestane 08/10/18 due to severe hot flashes   08/12/2016 Miscellaneous   ABC clinical trial (aspirin versus placebo)     CHIEF COMPLIANT: Follow-up of right breast cancer on exemestane and aspirin in ABC trial  INTERVAL HISTORY: Chelsea Patterson is a 64 y.o. with above-mentioned history of right breast cancer treated with bilateral mastectomies, radiation, and is now on antiestrogen therapy with exemestane.She is a participant in the ABC clinical trial and was randomized to receive aspirin as opposed to  the placebo. She presents to the clinic today for follow-up.  She stopped taking gabapentin because it was not helping her hot flashes.  She takes as needed tramadol and tizanidine. She tells me she is doing significantly better than last year. Occasional bruising is related to the study drug.  It is no different than before.  She does not have any bleeding symptoms.  ALLERGIES:  is allergic to wellbutrin [bupropion]; latex; and prozac [fluoxetine hcl].  MEDICATIONS:  Current Outpatient Medications  Medication Sig Dispense Refill  . acetaminophen (TYLENOL ARTHRITIS PAIN) 650 MG CR tablet Take 2,600 mg by mouth every 8 (eight) hours as needed for pain. Reported on 12/22/2015    . acyclovir (ZOVIRAX) 400 MG tablet acyclovir 400 mg tablet  TAKE 1 TABLET BY MOUTH TWICE A DAY    . anastrozole (ARIMIDEX) 1 MG tablet Take 1 tablet (1 mg total) by mouth daily. 90 tablet 3  . atorvastatin (LIPITOR) 40 MG tablet Take 40 mg by mouth daily at 6 PM.     . b complex vitamins tablet Take 1 tablet by mouth daily.    . cholecalciferol (VITAMIN D) 1000 UNITS tablet Take by mouth daily. Takes between 2-4    . Collagen-Vitamin C (COLLAGEN PLUS VITAMIN C PO) Take 1 tablet by mouth daily. Reported on 12/22/2015    . diphenhydramine-acetaminophen (TYLENOL PM) 25-500 MG TABS tablet Take 1 tablet by mouth at bedtime.     Marland Kitchen escitalopram (LEXAPRO) 20 MG tablet Take 20 mg by mouth daily.     . fish oil-omega-3 fatty acids 1000 MG capsule Take 2 g by mouth daily.    Marland Kitchen  Investigational aspirin/placebo 300 MG tablet [facility] [id number] Take 1 tablet by mouth daily. Take with food or a full glass of water.  Do not crush enteric coated tablets. 180 tablet 0  . Multiple Vitamin (MULTIVITAMIN) tablet Take 1 tablet by mouth daily.    Vladimir Faster Glycol-Propyl Glycol (SYSTANE OP) Place 1-2 drops into both eyes as needed (for dry eyes).    Marland Kitchen tiotropium (SPIRIVA) 18 MCG inhalation capsule Place 18 mcg into inhaler and inhale daily.    Marland Kitchen  tiZANidine (ZANAFLEX) 2 MG tablet Take 1 tablet (2 mg total) by mouth every 8 (eight) hours as needed for muscle spasms.    . traMADol (ULTRAM) 50 MG tablet Take 1 tablet (50 mg total) by mouth every 12 (twelve) hours as needed. 30 tablet   . varenicline (CHANTIX) 1 MG tablet Chantix 1 mg tablet  TAKE 1 TABLET BY MOUTH TWICE A DAY    . XOPENEX HFA 45 MCG/ACT inhaler Inhale 2 puffs into the lungs every 4 (four) hours as needed for wheezing or shortness of breath.      No current facility-administered medications for this visit.    PHYSICAL EXAMINATION: ECOG PERFORMANCE STATUS: 1 - Symptomatic but completely ambulatory  Vitals:   07/29/19 0935  BP: 134/88  Pulse: 99  Resp: 17  Temp: 98 F (36.7 C)  SpO2: 98%   Filed Weights   07/29/19 0935  Weight: 127 lb 4.8 oz (57.7 kg)    BREAST: No palpable masses or nodules in either right or left breasts. No palpable axillary supraclavicular or infraclavicular adenopathy no breast tenderness or nipple discharge. (exam performed in the presence of a chaperone)  LABORATORY DATA:  I have reviewed the data as listed CMP Latest Ref Rng & Units 10/27/2015 10/27/2015 10/08/2015  Glucose 65 - 99 mg/dL - 96 89  BUN 6 - 20 mg/dL - 11 11  Creatinine 0.44 - 1.00 mg/dL 0.78 0.84 0.87  Sodium 135 - 145 mmol/L - 144 141  Potassium 3.5 - 5.1 mmol/L - 4.6 4.8  Chloride 101 - 111 mmol/L - 109 106  CO2 22 - 32 mmol/L - 24 28  Calcium 8.9 - 10.3 mg/dL - 9.4 9.6  Total Protein 6.5 - 8.1 g/dL - 6.5 6.7  Total Bilirubin 0.3 - 1.2 mg/dL - 0.5 0.5  Alkaline Phos 38 - 126 U/L - 54 59  AST 15 - 41 U/L - 23 34  ALT 14 - 54 U/L - 16 38    Lab Results  Component Value Date   WBC 14.2 (H) 10/27/2015   HGB 11.2 (L) 10/27/2015   HCT 33.7 (L) 10/27/2015   MCV 96.3 10/27/2015   PLT 249 10/27/2015   NEUTROABS 4.2 10/27/2015    ASSESSMENT & PLAN:  Breast cancer of upper-outer quadrant of right female breast ([place]) Bilateral mastectomies 10/27/2015: Right  mastectomy: IDC grade 1, 2 foci, largest measures 1.8 cm, 2/6 lymph nodes positive T1cN1 stage II a, ER/PR positive HER-2 negative Ki-67 3%; left mastectomy: ALH; Mammaprint low risk, luminal type A (did not need chemotherapy) Adjuvant radiation therapy from 12/09/2015 to 01/27/2016   Current treatment: Adjuvant antiestrogen therapy with anastrozole 1 mg by mouth daily switched to Letrozole 05/04/16 due to musculoskeletal Sx, switched to exemestane January 2020.  But her insurance is not covering it so we are switching her back to anastrozole 1 mg daily 01/24/2019  Anastrozole toxicities: 1.  Hot flashes: Mild to moderate being monitored 2.  Joint aches and pains: Patient takes magnesium  3.  Dry skin: Encouraged increasing fluid intake and moisturizers  ABC clinical trial: Very mild bruising denies any stomach upset or bleeding symptoms.  Breast cancer surveillance: 1.Breast exam J 07/29/2019: Benign 2.Mammogram not necessary since she had bilateral mastectomies.  Return to clinic in 68month for follow-up    No orders of the defined types were placed in this encounter.  The patient has a good understanding of the overall plan. she agrees with it. she will call with any problems that may develop before the next visit here.  Total time spent: 15 mins including face to face time and time spent for planning, charting and coordination of care  GNicholas Lose MD 07/29/2019  I, MCloyde ReamsDorshimer, am acting as scribe for Dr. VNicholas Lose  I have reviewed the above documentation for accuracy and completeness, and I agree with the above.

## 2019-07-29 ENCOUNTER — Encounter: Payer: Self-pay | Admitting: *Deleted

## 2019-07-29 ENCOUNTER — Other Ambulatory Visit: Payer: Self-pay

## 2019-07-29 ENCOUNTER — Inpatient Hospital Stay: Payer: Medicare Other | Attending: Hematology and Oncology | Admitting: Hematology and Oncology

## 2019-07-29 ENCOUNTER — Telehealth: Payer: Self-pay | Admitting: Hematology and Oncology

## 2019-07-29 ENCOUNTER — Ambulatory Visit: Payer: Medicare Other | Admitting: Hematology and Oncology

## 2019-07-29 DIAGNOSIS — N951 Menopausal and female climacteric states: Secondary | ICD-10-CM | POA: Diagnosis not present

## 2019-07-29 DIAGNOSIS — Z9013 Acquired absence of bilateral breasts and nipples: Secondary | ICD-10-CM | POA: Diagnosis not present

## 2019-07-29 DIAGNOSIS — Z923 Personal history of irradiation: Secondary | ICD-10-CM | POA: Insufficient documentation

## 2019-07-29 DIAGNOSIS — Z006 Encounter for examination for normal comparison and control in clinical research program: Secondary | ICD-10-CM | POA: Diagnosis present

## 2019-07-29 DIAGNOSIS — Z79811 Long term (current) use of aromatase inhibitors: Secondary | ICD-10-CM | POA: Insufficient documentation

## 2019-07-29 DIAGNOSIS — Z170 Estrogen receptor positive status [ER+]: Secondary | ICD-10-CM

## 2019-07-29 DIAGNOSIS — C50411 Malignant neoplasm of upper-outer quadrant of right female breast: Secondary | ICD-10-CM

## 2019-07-29 MED ORDER — INV-ASPIRIN/PLACEBO 300 MG TABS ALLIANCE A011502: 1.0000 | ORAL_TABLET | Freq: Every day | ORAL | 0 refills | Status: DC

## 2019-07-29 MED ORDER — ANASTROZOLE 1 MG PO TABS: 1.0000 mg | ORAL_TABLET | Freq: Every day | ORAL | 3 refills | Status: DC

## 2019-07-29 MED ORDER — TIZANIDINE HCL 2 MG PO TABS: 2.0000 mg | ORAL_TABLET | Freq: Three times a day (TID) | ORAL | Status: DC | PRN

## 2019-07-29 MED ORDER — TRAMADOL HCL 50 MG PO TABS: 50.0000 mg | ORAL_TABLET | Freq: Two times a day (BID) | ORAL | Status: AC | PRN

## 2019-07-29 NOTE — Telephone Encounter (Signed)
I talk with patient regarding schedule  

## 2019-07-29 NOTE — Assessment & Plan Note (Signed)
Bilateral mastectomies 10/27/2015: Right mastectomy: IDC grade 1, 2 foci, largest measures 1.8 cm, 2/6 lymph nodes positive T1cN1 stage II a, ER/PR positive HER-2 negative Ki-67 3%; left mastectomy: ALH; Mammaprint low risk, luminal type A (did not need chemotherapy) Adjuvant radiation therapy from 12/09/2015 to 01/27/2016   Current treatment: Adjuvant antiestrogen therapy with anastrozole 1 mg by mouth daily switched to Letrozole 05/04/16 due to musculoskeletal Sx, switched to exemestane January 2020.  But her insurance is not covering it so we are switching her back to anastrozole 1 mg daily 01/24/2019  Anastrozole toxicities: 1.  Hot flashes: Mild to moderate being monitored 2.  Joint aches and pains: Patient takes magnesium 3.  Dry skin: Encouraged increasing fluid intake and moisturizers  ABC clinical trial: Very mild bruising denies any stomach upset or bleeding symptoms.  Breast cancer surveillance: 1.Breast exam J 07/29/2019: Benign 2.Mammogram not necessary since she had bilateral mastectomies.  Return to clinic in 37month for follow-up

## 2019-07-29 NOTE — Research (Signed)
07/29/19 at 10:30am- [id number] study notes Chelsea Patterson: Month 36 study visit Patient presented to the cancer [facility] today for her month 36 visit for the Chelsea Patterson study.  I met with the patient in the exam room after the patient's vital signs were obtained. Patient provided me with her medication diaries from the prior 6 months, as well as her previously dispensed study drug (aspirin/placebo). Dr. Lindi Patterson met with patient and addressed patient's questions/concerns. H&P: Completed by Dr. Lindi Patterson today in clinic. Vitals with weight were obtained. Solicited AE's: Patient denies specifically having any GI bleeding, intracrannial hemorrhage, epistaxis, hematuria, dyspepsia and gastritis. Patient does confirm having continued localized bruising (gr 1), with no worsening since previous reporting. MD feels that her bruising is probably related to her study drug.  Other Adverse Events: The pt complained of mild, dry skin (grade 1 dry skin).  The pt also complained of moderate hot flashes (grade 2 hot flashes) and moderate joint aches/pains (grade 2 arthralgia).  Dr. Lindi Patterson stated that both of these AE's were definitely related to the pt's anastrozole.  He felt that the pt's dry skin was probably related to her anastrozole.  He felt that these 3 AE's were unrelated to the pt's study drug (aspirin or placebo).  Non-protocol use of aspririn or other NSAIDs during this reporting period: Patient reported using Tylenol and Ultram prn as needed for her joint aches, but she specifically denied using aspirin or other NSAIDs during this reporting period.   Study Medication Diaries: Patient returned medication diaries reflecting months July through January 10th of 2021, with start date of July 9th, 2020 and end date of January 10th, 2021 (per patient she takes med in the evening). Patient reports no missed doses.  Patient was given medication diaries, for documentation starting today through the next 6 months (July 2021), patient knows to document  daily self administration.  Study Drug Aspirin/Placebo: Patient returned previously dispensed study medication bottle ([id number]) with 21 tablets of aspirin/placebo remaining, count and documentation of return verified by PharmD Chelsea Patterson. Chelsea Patterson also dispensed patient's next six months of study drug aspirin/placebo 300 mg, 1 bottle containing 200 tablets in which I provided to the patient and instructed her to continue taking 1 pill daily.  Plan:Patient will be scheduled to return in 6 months time for her 42 month on study visit. All patient's questions answered to her satisfaction. Patient thanked for her continued support of study and was encouraged to call Dr. Lindi Patterson with any questions or concerns.  Chelsea Aliment RN, BSN, [place] Clinical Research Nurse 07/29/2019 10:38 AM   Chelsea Patterson [phone]  07/29/2019  Adverse Event Log Chelsea Patterson study/ [facility] [id number] (date range 01/24/2019 -07/29/2019) According to section 9.2 of the protocol the following AE's are not reportable: hot flashes, joint  aches/pains, and dry skin due to their attribution  Event Grade Onset Date Resolved Date Drug Name Attribution Treatment Comments  Bruising  1 02/09/17 ongoing Aspirin 300 mg or Placebo Probably None Mild bruising when she bumps into objects   Hot flashes  2 Prior to study drug ongoing Aspirin 300 mg or Placebo  Unrelated Pt stopped gabapentin because it was not working.   Related to pt's anastrozole  Arthralgia  2  Prior to study drug Ongoing  Aspirin 300 mg or Placebo Unrelated pt was prescribed tramadol prn for joint pain Related to the pt's anastrozole  Dry skin  1 Prior to study drug  Ongoing  Aspirin 300 mg or Placebo  Unrelated Encouraged to use  moisturizers and to increase fluid intake MD feels that this AE is related to the pt's anastrozole

## 2019-08-12 ENCOUNTER — Ambulatory Visit: Payer: Medicare Other

## 2019-08-15 ENCOUNTER — Ambulatory Visit: Payer: Medicare Other

## 2019-08-23 ENCOUNTER — Ambulatory Visit: Payer: Medicare Other | Attending: Internal Medicine

## 2019-08-23 DIAGNOSIS — Z23 Encounter for immunization: Secondary | ICD-10-CM | POA: Insufficient documentation

## 2019-08-23 NOTE — Progress Notes (Signed)
   Covid-19 Vaccination Clinic  Name:  Chelsea Patterson    MRN: [id number] DOB: 03-11-1956  08/23/2019  [name] was observed post Covid-19 immunization for 15 minutes without incidence. She was provided with Vaccine Information Sheet and instruction to access the V-Safe system.   [name] was instructed to call 911 with any severe reactions post vaccine: Marland Kitchen Difficulty breathing  . Swelling of your face and throat  . A fast heartbeat  . A bad rash all over your body  . Dizziness and weakness    Immunizations Administered    Name Date Dose VIS Date Route   Pfizer COVID-19 Vaccine 08/23/2019  2:17 PM 0.3 mL 06/28/2019 Intramuscular   Manufacturer: [place]   Lot: [id number]   [place]: [id number]

## 2019-09-17 ENCOUNTER — Ambulatory Visit: Payer: Medicare Other | Attending: Internal Medicine

## 2019-09-17 DIAGNOSIS — Z23 Encounter for immunization: Secondary | ICD-10-CM | POA: Insufficient documentation

## 2019-09-17 NOTE — Progress Notes (Signed)
   Covid-19 Vaccination Clinic  Name:  Chelsea Patterson    MRN: [id number] DOB: 1955-07-22  09/17/2019  Chelsea Patterson was observed post Covid-19 immunization for 15 minutes without incident. She was provided with Vaccine Information Sheet and instruction to access the V-Safe system.   Chelsea Patterson was instructed to call 911 with any severe reactions post vaccine: Marland Kitchen Difficulty breathing  . Swelling of face and throat  . A fast heartbeat  . A bad rash all over body  . Dizziness and weakness

## 2019-10-08 ENCOUNTER — Encounter: Payer: Self-pay | Admitting: Certified Nurse Midwife

## 2019-11-05 ENCOUNTER — Other Ambulatory Visit: Payer: Self-pay | Admitting: Family Medicine

## 2019-11-05 ENCOUNTER — Ambulatory Visit
Admission: RE | Admit: 2019-11-05 | Discharge: 2019-11-05 | Disposition: A | Discharge status: Home / Self Care | Payer: Medicare Other | Source: Ambulatory Visit | Attending: Family Medicine | Admitting: Family Medicine

## 2019-11-05 ENCOUNTER — Other Ambulatory Visit: Payer: Self-pay

## 2019-11-05 DIAGNOSIS — M542 Cervicalgia: Secondary | ICD-10-CM

## 2019-12-25 ENCOUNTER — Other Ambulatory Visit: Payer: Self-pay | Admitting: Family Medicine

## 2019-12-25 DIAGNOSIS — M5412 Radiculopathy, cervical region: Secondary | ICD-10-CM

## 2019-12-28 ENCOUNTER — Other Ambulatory Visit: Payer: Medicare Other

## 2019-12-28 ENCOUNTER — Other Ambulatory Visit: Payer: Self-pay

## 2019-12-28 ENCOUNTER — Other Ambulatory Visit: Payer: Self-pay | Admitting: Family Medicine

## 2019-12-28 ENCOUNTER — Ambulatory Visit
Admission: RE | Admit: 2019-12-28 | Discharge: 2019-12-28 | Disposition: A | Discharge status: Home / Self Care | Payer: Medicare Other | Source: Ambulatory Visit | Attending: Family Medicine | Admitting: Family Medicine

## 2019-12-28 DIAGNOSIS — M5412 Radiculopathy, cervical region: Secondary | ICD-10-CM

## 2019-12-28 MED ORDER — GADOBENATE DIMEGLUMINE 529 MG/ML IV SOLN
10.0000 mL | Freq: Once | INTRAVENOUS | Status: AC | PRN
  Administered 2019-12-28: 10 mL via INTRAVENOUS

## 2020-01-22 ENCOUNTER — Other Ambulatory Visit: Payer: Self-pay | Admitting: *Deleted

## 2020-01-26 NOTE — Progress Notes (Signed)
Patient Care Team: Shirline Frees, MD as PCP - General (Family Medicine) Kem Boroughs, [place] as Nurse Practitioner (Family Medicine) Sylvan Cheese, NP as Nurse Practitioner (Hematology and Oncology)  DIAGNOSIS:    ICD-10-CM   1. Malignant neoplasm of upper-outer quadrant of right breast in female, estrogen receptor positive ([place])  C50.411    Z17.0     SUMMARY OF ONCOLOGIC HISTORY: Oncology History  Breast cancer of upper-outer quadrant of right female breast ([place])  09/04/2015 Initial Diagnosis   Right breast biopsy invasive ductal carcinoma with papillary features, grade 1, ER/PR positive HER-2 negative Ki-67 3%; 6 month follow-up mamm: stable left breast calcifications but new right breast calcifications 2 sets 2 mm size (only one set biopsied)   10/27/2015 Surgery   Bilateral mastectomies [name]), right mastectomy: IDC grade 1, 2 foci, largest measures 1.8 cm, 2/6 lymph nodes positive T1cN1 stage II a, ER/PR positive HER-2 negative Ki-67 3%; left mastectomy: ALH.   10/27/2015 Oncotype testing   Mammaprint: Low risk, Luminal-type    12/09/2015 - 01/27/2016 Radiation Therapy   Adjuvant radiation therapy ([name]). Right chest wall, 50.4 Gy at 28 fractions.  Right chest/mastectomy scar boost, 10 Gy at 5 fractions    02/2016 -  Anti-estrogen oral therapy   Anastrozole 1 mg daily; switched to letrozole 2.5 mg daily 05/04/2016 due to myalgias and arthralgias; switched to exemestane 08/10/18 due to severe hot flashes   08/12/2016 Miscellaneous   ABC clinical trial (aspirin versus placebo)     CHIEF COMPLIANT:  Follow-up of right breast cancer on exemestane and aspirin in ABC trial  INTERVAL HISTORY: Chelsea Patterson is a 64 y.o. with above-mentioned history of right breast cancer treated with bilateral mastectomies,radiation,and isnow on antiestrogen therapy with exemestane.She is a participant in the ABC clinical trial and was randomized to receive aspirin as opposed to  the placebo.She presents to the clinic todayfor follow-up.  Recently patient has been experiencing multiple problems including neck pain related to disc issues.  She is seeing neurology.  She had a neck MRI which did not show any metastatic disease.  She also had a recent infection for which she took clindamycin and amoxicillin.  It was a dental infection.  Because of all of this her stomach is extremely irritated and she gets epigastric abdominal discomfort.  She does not think any of these are related to the study medication.  Mild bruising  ALLERGIES:  is allergic to wellbutrin [bupropion], latex, and prozac [fluoxetine hcl].  MEDICATIONS:  Current Outpatient Medications  Medication Sig Dispense Refill  . acetaminophen (TYLENOL ARTHRITIS PAIN) 650 MG CR tablet Take 2,600 mg by mouth every 8 (eight) hours as needed for pain. Reported on 12/22/2015    . acyclovir (ZOVIRAX) 400 MG tablet acyclovir 400 mg tablet  TAKE 1 TABLET BY MOUTH TWICE A DAY    . anastrozole (ARIMIDEX) 1 MG tablet Take 1 tablet (1 mg total) by mouth daily. 90 tablet 3  . atorvastatin (LIPITOR) 40 MG tablet Take 40 mg by mouth daily at 6 PM.     . b complex vitamins tablet Take 1 tablet by mouth daily.    . cholecalciferol (VITAMIN D) 1000 UNITS tablet Take by mouth daily. Takes between 2-4    . Collagen-Vitamin C (COLLAGEN PLUS VITAMIN C PO) Take 1 tablet by mouth daily. Reported on 12/22/2015    . diphenhydramine-acetaminophen (TYLENOL PM) 25-500 MG TABS tablet Take 1 tablet by mouth at bedtime.     Marland Kitchen escitalopram (LEXAPRO) 20  MG tablet Take 20 mg by mouth daily.     . fish oil-omega-3 fatty acids 1000 MG capsule Take 2 g by mouth daily.    . Investigational aspirin/placebo 300 MG tablet [facility] [id number] Take 1 tablet by mouth daily. Take with food or a full glass of water.  Do not crush enteric coated tablets. 180 tablet 0  . Multiple Vitamin (MULTIVITAMIN) tablet Take 1 tablet by mouth daily.    Vladimir Faster Glycol-Propyl  Glycol (SYSTANE OP) Place 1-2 drops into both eyes as needed (for dry eyes).    Marland Kitchen tiotropium (SPIRIVA) 18 MCG inhalation capsule Place 18 mcg into inhaler and inhale daily.    Marland Kitchen tiZANidine (ZANAFLEX) 2 MG tablet Take 1 tablet (2 mg total) by mouth every 8 (eight) hours as needed for muscle spasms.    . traMADol (ULTRAM) 50 MG tablet Take 1 tablet (50 mg total) by mouth every 12 (twelve) hours as needed. 30 tablet   . varenicline (CHANTIX) 1 MG tablet Chantix 1 mg tablet  TAKE 1 TABLET BY MOUTH TWICE A DAY    . XOPENEX HFA 45 MCG/ACT inhaler Inhale 2 puffs into the lungs every 4 (four) hours as needed for wheezing or shortness of breath.      No current facility-administered medications for this visit.    PHYSICAL EXAMINATION: ECOG PERFORMANCE STATUS: 1 - Symptomatic but completely ambulatory  Vitals:   01/27/20 0925  BP: 127/86  Pulse: 97  Resp: 17  Temp: 98.7 F (37.1 C)  SpO2: 98%   Filed Weights   01/27/20 0925  Weight: 126 lb 4.8 oz (57.3 kg)    BREAST: No palpable masses or nodules in either right or left breasts. No palpable axillary supraclavicular or infraclavicular adenopathy no breast tenderness or nipple discharge. (exam performed in the presence of a chaperone)  LABORATORY DATA:  I have reviewed the data as listed CMP Latest Ref Rng & Units 10/27/2015 10/27/2015 10/08/2015  Glucose 65 - 99 mg/dL - 96 89  BUN 6 - 20 mg/dL - 11 11  Creatinine 0.44 - 1.00 mg/dL 0.78 0.84 0.87  Sodium 135 - 145 mmol/L - 144 141  Potassium 3.5 - 5.1 mmol/L - 4.6 4.8  Chloride 101 - 111 mmol/L - 109 106  CO2 22 - 32 mmol/L - 24 28  Calcium 8.9 - 10.3 mg/dL - 9.4 9.6  Total Protein 6.5 - 8.1 g/dL - 6.5 6.7  Total Bilirubin 0.3 - 1.2 mg/dL - 0.5 0.5  Alkaline Phos 38 - 126 U/L - 54 59  AST 15 - 41 U/L - 23 34  ALT 14 - 54 U/L - 16 38    Lab Results  Component Value Date   WBC 14.2 (H) 10/27/2015   HGB 11.2 (L) 10/27/2015   HCT 33.7 (L) 10/27/2015   MCV 96.3 10/27/2015   PLT 249  10/27/2015   NEUTROABS 4.2 10/27/2015    ASSESSMENT & PLAN:  Breast cancer of upper-outer quadrant of right female breast ([place]) Bilateral mastectomies 10/27/2015: Right mastectomy: IDC grade 1, 2 foci, largest measures 1.8 cm, 2/6 lymph nodes positive T1cN1 stage II a, ER/PR positive HER-2 negative Ki-67 3%; left mastectomy: ALH; Mammaprint low risk, luminal type A (did not need chemotherapy) Adjuvant radiation therapy from 12/09/2015 to 01/27/2016   Current treatment: Adjuvant antiestrogen therapy with anastrozole 1 mg by mouth daily switched to Letrozole 05/04/16 due to musculoskeletal Sx,switched to exemestane January 2020. But her insurance is not covering it so we switched her back  to anastrozole 1 mg daily 01/24/2019  Anastrozole toxicities: 1.  Hot flashes: Mild to moderate being monitored 2.  Joint aches and pains: Patient takes magnesium 3.  Dry skin: Encouraged increasing fluid intake and moisturizers  ABC clinical trial: mild bruising denies any stomach upset or bleeding symptoms. Epigastric discomfort: Suspect gastritis most likely related to recent steroid use as well as her current antibiotics. We would characterize this as grade 2 Our recommendation will be to hold aspirin until the symptoms get better. We will also try to prescribe a proton pump inhibitor.  We are checking to see if the study can cover for such a medication.  Breast cancer surveillance: 1.Breast exam 01/27/2020: Benign 2.Mammogram not necessary since she had bilateral mastectomies. No clinical evidence of disease recurrence.  Return to clinic in 6 months for follow-up    No orders of the defined types were placed in this encounter.  The patient has a good understanding of the overall plan. she agrees with it. she will call with any problems that may develop before the next visit here.  Total time spent: 30 mins including face to face time and time spent for planning, charting and coordination  of care  Nicholas Lose, MD 01/27/2020  I, Cloyde Reams Dorshimer, am acting as scribe for Dr. Nicholas Lose.  I have reviewed the above documentation for accuracy and completeness, and I agree with the above.

## 2020-01-27 ENCOUNTER — Encounter: Payer: Self-pay | Admitting: *Deleted

## 2020-01-27 ENCOUNTER — Inpatient Hospital Stay: Payer: Medicare Other | Attending: Hematology and Oncology | Admitting: Hematology and Oncology

## 2020-01-27 ENCOUNTER — Other Ambulatory Visit: Payer: Self-pay

## 2020-01-27 DIAGNOSIS — Z170 Estrogen receptor positive status [ER+]: Secondary | ICD-10-CM | POA: Insufficient documentation

## 2020-01-27 DIAGNOSIS — Z006 Encounter for examination for normal comparison and control in clinical research program: Secondary | ICD-10-CM

## 2020-01-27 DIAGNOSIS — Z79811 Long term (current) use of aromatase inhibitors: Secondary | ICD-10-CM | POA: Diagnosis not present

## 2020-01-27 DIAGNOSIS — Z9013 Acquired absence of bilateral breasts and nipples: Secondary | ICD-10-CM

## 2020-01-27 DIAGNOSIS — R109 Unspecified abdominal pain: Secondary | ICD-10-CM | POA: Insufficient documentation

## 2020-01-27 DIAGNOSIS — Z923 Personal history of irradiation: Secondary | ICD-10-CM | POA: Insufficient documentation

## 2020-01-27 DIAGNOSIS — M2550 Pain in unspecified joint: Secondary | ICD-10-CM | POA: Insufficient documentation

## 2020-01-27 DIAGNOSIS — C50411 Malignant neoplasm of upper-outer quadrant of right female breast: Secondary | ICD-10-CM

## 2020-01-27 DIAGNOSIS — N951 Menopausal and female climacteric states: Secondary | ICD-10-CM

## 2020-01-27 NOTE — Assessment & Plan Note (Signed)
Bilateral mastectomies 10/27/2015: Right mastectomy: IDC grade 1, 2 foci, largest measures 1.8 cm, 2/6 lymph nodes positive T1cN1 stage II a, ER/PR positive HER-2 negative Ki-67 3%; left mastectomy: ALH; Mammaprint low risk, luminal type A (did not need chemotherapy) Adjuvant radiation therapy from 12/09/2015 to 01/27/2016   Current treatment: Adjuvant antiestrogen therapy with anastrozole 1 mg by mouth daily switched to Letrozole 05/04/16 due to musculoskeletal Sx,switched to exemestane January 2020. But her insurance is not covering it so we switched her back to anastrozole 1 mg daily 01/24/2019  Anastrozole toxicities: 1.  Hot flashes: Mild to moderate being monitored 2.  Joint aches and pains: Patient takes magnesium 3.  Dry skin: Encouraged increasing fluid intake and moisturizers  ABC clinical trial: Very mild bruising denies any stomach upset or bleeding symptoms.  Breast cancer surveillance: 1.Breast exam 01/27/2020: Benign 2.Mammogram not necessary since she had bilateral mastectomies.  Return to clinic in 6 months for follow-up

## 2020-01-27 NOTE — Research (Signed)
01/27/2020 Alliance [id number] ABC - Month 42 visit Patient in to clinic unaccompanied today for Alliance [id number] ABC study 42 month follow-up visit.  Adverse events and NSAID use: Solicited AEs were reviewed with the patient. She denies any symptoms of gastrointestinal bleeding, nosebleeds, blood in her urine or gastritis. There has been no documentation of intracranial hemorrhage. She has experienced no cardiovascular events or serious adverse events.  Patient reports that she has ongoing mild bruising of her arms and legs, unchanged from previous (grade 1). She notes that when she has a cut, it does clot quickly. She reports worsening indigestion symptoms which she attributes to overeating, usually if late in the day. She states, "I don't want to take Zantac". She reports drinking a Coke to make herself burp and that generally resolves the symptoms. Symptoms occur infrequently and are moderate in nature per patient (grade 2). She denies taking any aspirin or NSAIDs and is only taking Tylenol for pain. Patient reports other intercurrent illnesses: . Root canal performed 2 weeks ago became infected. Originally treatment with clindamycin but was switched to amoxicillin. . Complaints of neck pain over the past six months, initially treated as muscular in nature by PCP until symptoms of tingling developed. Patient reports having an MRI at [facility] in June showing 2 bulging discs in her cervical spine. She has been treated with 2 rounds of prednisone, tramadol and Tylenol w/codeine. She is scheduled to see Dr. Sherley Bounds @ [facility] tomorrow for evaluation.  Study medication: Patient returned bottle dispensed at Month 36 visit today, which was returned to pharmacist Raul Del for proper drug accountability. Patient states she has missed no doses of study medication. Due to current GI symptoms and upcoming neurosurgery evaluation, study medication to be held at this time. Per  investigator, will initiate study-provided PPI this week if possible. Patient is in agreement to try a 2-week course of PPI as MD feels this could help alleviate her GI symptoms.  Medication Logs: Patient returned medication logs covering January 2021 through July 2021, with last dose recorded on 01/26/2020.   Cindy S. Brigitte Pulse BSN, RN, [place] 01/27/2020 10:52 AM   Adverse Event Log ABC study/ Alliance [phone] (date range1/06/2020-01/27/2020) According to section 9.2 of the protocol the following AE's are not reportable: hot flashes, joint aches/pains, and dry skin due to their attribution  Event Grade Onset Date Resolved Date Drug Name Attribution Treatment Comments  Dry Skin  1 Prior to study drug Ongoing Aspirin 362m/  Placebo Unrelated Pt reports using oils for better control of her symptoms   Bruising  1 02/09/17 ongoing Aspirin 300 mg/ Placebo Probably None Mild bruising  Hot flashes  2 Prior to study drug ongoing Aspirin 300 mg/ Placebo  Unrelated None  Related to pt's anastrozole  Arthralgia  2  Prior to study drug Ongoing  Aspirin 300 mg/ Placebo Unrelated pt was prescribed tramadol prn for joint pain Related to the pt's anastrozole  Dyspepsia  2 01/27/2020 Ongoing  Aspirin 3024m   Placebo Possible  Study drug on hold, and Pt prescribed pantoprazole 40 mg daily x 2 weeks  Pt describes her symptoms as "heartburn and indigestion"  Infection, tooth 2 01/16/2020 Ongoing Aspirin 30044mPlacebo  Unrelated Pt is still taking antibiotics to clear this infection.  Infection from root canal done on 01/14/20.   Neck Pain  2 11/05/19  Ongoing  Aspirin 300m19mPlacebo Unrelated  Pt takes Tylenol Arthritis.  MRI from 12/28/19 revealed "worsened stenosis at C3-4"  01/30/20 at 10:01am- Research nurse met with Dr. Lindi Adie to discuss the pt's reported AE's and his attribution for each AE related to the pt's ABC study drug (Aspirin 324m or Placebo).  Dr. GLindi Adiefelt that the pt's symptoms/descriptions were  more reflective of "dyspepsia" than "gastritis".  He provided an attribution of "possible" related to her study drug.   He felt that the pt's antibiotics were probably the cause for the pt's new onset of indigestion/heartburn symptoms.  He felt that the pt's tooth infection and neck pain were "unrelated" to the pt's study drug.  He signed the pantoprazole order for the pt take a 2 week course of this medication.  He wants the research staff to call the pt in 2 weeks to see if her GI symptoms have improved.  If the pt's symptoms have resolved, then he wants her ABC study drug restarted at her current dose of 300 mg.    NBrion AlimentRN, BSN, CCRP Clinical Research Nurse 01/30/2020 10:33 AM

## 2020-01-30 ENCOUNTER — Other Ambulatory Visit: Payer: Self-pay

## 2020-01-30 ENCOUNTER — Inpatient Hospital Stay: Payer: Medicare Other | Admitting: *Deleted

## 2020-01-30 ENCOUNTER — Encounter: Payer: Self-pay | Admitting: *Deleted

## 2020-01-30 ENCOUNTER — Other Ambulatory Visit: Payer: Self-pay | Admitting: Hematology and Oncology

## 2020-01-30 DIAGNOSIS — C50411 Malignant neoplasm of upper-outer quadrant of right female breast: Secondary | ICD-10-CM

## 2020-01-30 MED ORDER — INV-PANTOPRAZOLE 40 MG PO TABLET ALLIANCE A011502: 40.0000 mg | ORAL_TABLET | Freq: Every day | ORAL | 0 refills | Status: DC

## 2020-01-30 NOTE — Research (Signed)
01/30/20 at 12:15pm- ABC study- pantoprazole dispensed to the pt.  The pt was into the [facility] to meet with her research nurse and to receive her study provided- proton pump inhibitor (pantoprazole).  The pt's ABC study drug (aspirin 300 mg/placebo) is currently on hold due to the pt's grade 2 dyspepsia.  Dr. Lindi Adie stated that he wants the pt to take the pantoprazole for 2 weeks starting today through 02/12/2020.  He then asked that the research team contact her to assess her GI symptoms on 02/13/20.  He said that if her dyspepsia has resolved, then he wants her to resume her ABC study without a dose modification.   He said that he also would advise the pt to stop her pantoprazole after 2 weeks if her symptoms have resolved.  The pt was informed about this plan, and she was in agreement.  The pharmacist, Raul Del, dispensed the pantoprazole, and the research nurse gave the bottle to the pt.  The pt was instructed to take 1 pill with or without food for the next 2 weeks.  The pt said that she would begin her PPI this evening.  The pt was told that the pharmacist had reviewed her current medication list and found no significant drug interactions with her current medication list.  The research nurse asked the pt how her most recent appt with Dr. Ronnald Ramp on 01/28/20.  The pt said that there are no plans for surgery in the near future.  She said that he thought that she would benefit from an injection, and she is scheduled for this injection in mid-August.  The research nurse has requested Dr. Ronnald Ramp office notes for Dr. Lindi Adie to review.  The pt said that she has received previous injections while on her study drug (aspirin 300mg /placebo) without any complications.  The research nurse reviewed the pt's current AE's with the pt.  The pt confirmed that her bruising is mild (grade 1) and is still ongoing.  The pt reports the following AE's as ongoing:  hot flashes (grade 2), arthralgia (grade 2), and dry skin (grade 1).   These AE's are felt to be unrelated to her study drug and to be related to her anastrozole.  The pt confirmed that her neck pain (grade 2) began in April 2021 (date of cervical spine x-ray was given as start date by the pt-11/05/19).  The pt's tooth infection began "on the Thursday before the 4th of July" (the date of 01/16/20 will be used as start date of her tooth infection).  The pt reports she is still taking amoxicillin for this infection.  The pt was thanked for her support and compliance in the study.  The pt knows to call Dr. Lindi Adie with any problems or concerns.   Brion Aliment RN, BSN, CCRP Clinical Research Nurse 01/30/2020 3:27 PM

## 2020-01-31 ENCOUNTER — Ambulatory Visit: Payer: Medicare Other | Admitting: Certified Nurse Midwife

## 2020-02-06 ENCOUNTER — Other Ambulatory Visit: Payer: Self-pay | Admitting: *Deleted

## 2020-02-13 ENCOUNTER — Telehealth: Payer: Self-pay | Admitting: *Deleted

## 2020-02-13 NOTE — Telephone Encounter (Signed)
02/13/2020   Alliance [id number] ABC - interim toxicity check  Spoke with patient by phone today to follow up on symptoms of heartburn and dyspepsia after initiating treatment with pantoprazole on 01/30/2020.  Patient reports that she does feel some better now and would like to stay on pantoprazole if possible. She does report having had a breakthrough episode Monday night where she woke up with moderate-severe symptoms that improved after drinking a Coke.   Regarding neurosurgery follow up - Patient reports the following treatment to date:  She is taking Tylenol arthritis for pain.  She is scheduled for a nerve study the second week in August.  Steroid injection is currently planned for the third week in August.  Explained to patient that protocol allows study medication (aspirin 300mg /placebo) to be held for up to 28 days; she would need to resume treatment no later than 02/23/2020.   Will follow up with MD regarding plan. Patient is aware.  Cindy S. Brigitte Pulse BSN, RN, CCRP 02/13/2020 10:32 AM

## 2020-02-13 NOTE — Telephone Encounter (Signed)
02/13/2020  Discussed with Dr. Lindi Adie and the following instructions were received:  Per patient preference, okay for patient to continue pantoprazole for up to one month.  Reassess patient mid-next week to see if symptoms have improved to grade <= 1. If so, and if patient is in agreement, may resume investigational treatment at previous dose of aspirin 300mg /placebo daily by 02/23/2020 (maximum 28-day interruption).  If symptoms have not improved, or if unacceptable to patient, investigational treatment with aspirin 300mg /placebo may be permanently discontinued.  Cindy S. Brigitte Pulse BSN, RN, [place] 02/13/2020 1:55 PM

## 2020-02-13 NOTE — Telephone Encounter (Signed)
02/13/2020  Spoke with patient by phone to review the plan as proposed by Dr. Lindi Adie. Patient is in agreement to stay on pantoprazole for now and will be reassessed mid-next week for symptom status. If improved, and if investigational treatment is to be resumed, patient states that she should be able to come by late next week to pick up study medication.  Cindy S. Brigitte Pulse BSN, RN, [place] 02/13/2020 2:00 PM

## 2020-02-19 ENCOUNTER — Telehealth: Payer: Self-pay | Admitting: *Deleted

## 2020-02-19 NOTE — Telephone Encounter (Signed)
02/19/2020 at 3:34pm - The research nurse called the pt this afternoon to check on her dyspepsia.  The pt said that she has not had anymore episodes of heartburn.  The pt said that she was comfortable resuming her study drug (Aspirin 300mg /Placebo).  The pt stated that she does have an upcoming neurological procedure- cervical epidural injection on 03/11/20.  She said that she has been advised to stop all aspirin products.  The pt said that she was unsure about her study drug since she doesn't know if she is taking aspirin.  The pt was advised for safety reasons it was fine for her to stop her study drug for 7 days before the injection.  The pt verbalized understanding.  Dr. Lindi Adie was notified that the pt's dyspepsia has resolved.  He was in agreement to resume her study drug starting tomorrow.  The pt agreed to come to the [facility] to pick up her study drug and study drug diaries tomorrow on 02/20/20 at 10:30a.  Dr. Lindi Adie also wanted the pt to continue taking her PPI for now.  The pharmacy was notified that the pt will need her study drug dispensed tomorrow.  Orland Penman, clinical research nurse for the study, confirmed that the pt can keep her study provided PPI.  She said that the study has no study requirement for collecting or tracking the PPI provided on-study.  Brion Aliment RN, BSN, CCRP Clinical Research Nurse 02/19/2020 3:44 PM

## 2020-02-20 ENCOUNTER — Other Ambulatory Visit: Payer: Self-pay

## 2020-02-20 ENCOUNTER — Inpatient Hospital Stay: Payer: Medicare Other | Attending: Hematology and Oncology | Admitting: *Deleted

## 2020-02-20 ENCOUNTER — Encounter: Payer: Self-pay | Admitting: *Deleted

## 2020-02-20 DIAGNOSIS — Z170 Estrogen receptor positive status [ER+]: Secondary | ICD-10-CM

## 2020-02-20 DIAGNOSIS — C50411 Malignant neoplasm of upper-outer quadrant of right female breast: Secondary | ICD-10-CM

## 2020-02-20 MED ORDER — INV-ASPIRIN/PLACEBO 300 MG TABS ALLIANCE A011502: 1.0000 | ORAL_TABLET | Freq: Every day | ORAL | 0 refills | Status: DC

## 2020-02-20 NOTE — Research (Signed)
02/20/2020- Alliance/ ABC study drug dispensed to the pt - The pt was into the [facility] to meet with her research nurse to discuss re-starting her study drug on the ABC study.  The pt reports that her GI symptoms have resolved, and she is ready to resume her study drug.  Dr. Lindi Adie wanted the pt to resume her study drug at her previous dose - Aspirin 300 mg /Placebo.  Dr. Lindi Adie also wanted the pt to continue her study provided PPI for 1 month.  The pt started her PPI on 01/30/20.  The pt knows to continue taking her PPI until 03/01/20.  The pt knows that she is free to keep her study provided PPI medication.  The pt asked if her symptoms come back should she just restart her PPI.  The pt was instructed to call Dr. Lindi Adie or her research nurse if her heartburn/dyspepsia returns.  The pt was informed that the protocol does state "for recurrence of unacceptable grade 2 non-hematologic toxicity considered related to aspirin/placebo, or if grade 2 toxicity does not improve after 28 days, permanently discontinue aspirin/placebo".  The pt verbalizes understanding of the protocol.  The pharmacist, Kennith Center, dispensed the pt's study drug bottle to the research nurse.  The research nurse then gave the bottle to the pt along with her medication diaries for the months of August 2021 through January 2022.  The pt was instructed to take 1 pill daily with food starting today.  The pt's next on-study visit is scheduled for 08/04/2020 (month 48).  The pt knows to call the Dr. Lindi Adie or her research nurse for any concerns/questions in the interim.  The pt will stop her study drug for 7 days for a planned break for safety reasons before her cervical epidural injection on 03/11/20.   Brion Aliment RN, BSN, CCRP  Clinical Research Nurse 02/20/2020 11:57 AM
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
## 2020-05-08 ENCOUNTER — Encounter: Payer: Self-pay | Admitting: Obstetrics and Gynecology

## 2020-05-08 ENCOUNTER — Ambulatory Visit (INDEPENDENT_AMBULATORY_CARE_PROVIDER_SITE_OTHER): Payer: Medicare Other | Admitting: Obstetrics and Gynecology

## 2020-05-08 ENCOUNTER — Other Ambulatory Visit: Payer: Self-pay

## 2020-05-08 VITALS — BP 110/60 | HR 91 | Ht 67.0 in | Wt 129.4 lb

## 2020-05-08 DIAGNOSIS — Z9189 Other specified personal risk factors, not elsewhere classified: Secondary | ICD-10-CM

## 2020-05-08 DIAGNOSIS — Z01419 Encounter for gynecological examination (general) (routine) without abnormal findings: Secondary | ICD-10-CM

## 2020-05-08 DIAGNOSIS — F17200 Nicotine dependence, unspecified, uncomplicated: Secondary | ICD-10-CM

## 2020-05-08 NOTE — Patient Instructions (Addendum)
EXERCISE AND DIET:  We recommended that you start or continue a regular exercise program for good health. Regular exercise means any activity that makes your heart beat faster and makes you sweat.  We recommend exercising at least 30 minutes per day at least 3 days a week, preferably 4 or 5.  We also recommend a diet low in fat and sugar.  Inactivity, poor dietary choices and obesity can cause diabetes, heart attack, stroke, and kidney damage, among others.   ° °ALCOHOL AND SMOKING:  Women should limit their alcohol intake to no more than 7 drinks/beers/glasses of wine (combined, not each!) per week. Moderation of alcohol intake to this level decreases your risk of breast cancer and liver damage. And of course, no recreational drugs are part of a healthy lifestyle.  And absolutely no smoking or even second hand smoke. Most people know smoking can cause heart and lung diseases, but did you know it also contributes to weakening of your bones? Aging of your skin?  Yellowing of your teeth and nails? ° °CALCIUM AND VITAMIN D:  Adequate intake of calcium and Vitamin D are recommended.  The recommendations for exact amounts of these supplements seem to change often, but generally speaking 1,200 mg of calcium (between diet and supplement) and 800 units of Vitamin D per day seems prudent. Certain women may benefit from higher intake of Vitamin D.  If you are among these women, your doctor will have told you during your visit.   ° °PAP SMEARS:  Pap smears, to check for cervical cancer or precancers,  have traditionally been done yearly, although recent scientific advances have shown that most women can have pap smears less often.  However, every woman still should have a physical exam from her gynecologist every year. It will include a breast check, inspection of the vulva and vagina to check for abnormal growths or skin changes, a visual exam of the cervix, and then an exam to evaluate the size and shape of the uterus and  ovaries.  And after 66 years of age, a rectal exam is indicated to check for rectal cancers. We will also provide age appropriate advice regarding health maintenance, like when you should have certain vaccines, screening for sexually transmitted diseases, bone density testing, colonoscopy, mammograms, etc.  ° °MAMMOGRAMS:  All women over 40 years old should have a yearly mammogram. Many facilities now offer a "3D" mammogram, which may cost around $50 extra out of pocket. If possible,  we recommend you accept the option to have the 3D mammogram performed.  It both reduces the number of women who will be called back for extra views which then turn out to be normal, and it is better than the routine mammogram at detecting truly abnormal areas.   ° °COLON CANCER SCREENING: Now recommend starting at age 45. At this time colonoscopy is not covered for routine screening until 50. There are take home tests that can be done between 45-49.  ° °COLONOSCOPY:  Colonoscopy to screen for colon cancer is recommended for all women at age 50.  We know, you hate the idea of the prep.  We agree, BUT, having colon cancer and not knowing it is worse!!  Colon cancer so often starts as a polyp that can be seen and removed at colonscopy, which can quite literally save your life!  And if your first colonoscopy is normal and you have no family history of colon cancer, most women don't have to have it again for   10 years.  Once every ten years, you can do something that may end up saving your life, right?  We will be happy to help you get it scheduled when you are ready.  Be sure to check your insurance coverage so you understand how much it will cost.  It may be covered as a preventative service at no cost, but you should check your particular policy.   ° ° ° °Breast Self-Awareness °Breast self-awareness means being familiar with how your breasts look and feel. It involves checking your breasts regularly and reporting any changes to your  health care provider. °Practicing breast self-awareness is important. A change in your breasts can be a sign of a serious medical problem. Being familiar with how your breasts look and feel allows you to find any problems early, when treatment is more likely to be successful. All women should practice breast self-awareness, including women who have had breast implants. °How to do a breast self-exam °One way to learn what is normal for your breasts and whether your breasts are changing is to do a breast self-exam. To do a breast self-exam: °Look for Changes ° °1. Remove all the clothing above your waist. °2. Stand in front of a mirror in a room with good lighting. °3. Put your hands on your hips. °4. Push your hands firmly downward. °5. Compare your breasts in the mirror. Look for differences between them (asymmetry), such as: °? Differences in shape. °? Differences in size. °? Puckers, dips, and bumps in one breast and not the other. °6. Look at each breast for changes in your skin, such as: °? Redness. °? Scaly areas. °7. Look for changes in your nipples, such as: °? Discharge. °? Bleeding. °? Dimpling. °? Redness. °? A change in position. °Feel for Changes °Carefully feel your breasts for lumps and changes. It is best to do this while lying on your back on the floor and again while sitting or standing in the shower or tub with soapy water on your skin. Feel each breast in the following way: °· Place the arm on the side of the breast you are examining above your head. °· Feel your breast with the other hand. °· Start in the nipple area and make ¾ inch (2 cm) overlapping circles to feel your breast. Use the pads of your three middle fingers to do this. Apply light pressure, then medium pressure, then firm pressure. The light pressure will allow you to feel the tissue closest to the skin. The medium pressure will allow you to feel the tissue that is a little deeper. The firm pressure will allow you to feel the tissue  close to the ribs. °· Continue the overlapping circles, moving downward over the breast until you feel your ribs below your breast. °· Move one finger-width toward the center of the body. Continue to use the ¾ inch (2 cm) overlapping circles to feel your breast as you move slowly up toward your collarbone. °· Continue the up and down exam using all three pressures until you reach your armpit. ° °Write Down What You Find ° °Write down what is normal for each breast and any changes that you find. Keep a written record with breast changes or normal findings for each breast. By writing this information down, you do not need to depend only on memory for size, tenderness, or location. Write down where you are in your menstrual cycle, if you are still menstruating. °If you are having trouble noticing differences   in your breasts, do not get discouraged. With time you will become more familiar with the variations in your breasts and more comfortable with the exam. How often should I examine my breasts? Examine your breasts every month. If you are breastfeeding, the best time to examine your breasts is after a feeding or after using a breast pump. If you menstruate, the best time to examine your breasts is 5-7 days after your period is over. During your period, your breasts are lumpier, and it may be more difficult to notice changes. When should I see my health care provider? See your health care provider if you notice:  A change in shape or size of your breasts or nipples.  A change in the skin of your breast or nipples, such as a reddened or scaly area.  Unusual discharge from your nipples.  A lump or thick area that was not there before.  Pain in your breasts.  Anything that concerns you.   Steps to Quit Smoking Smoking tobacco is the leading cause of preventable death. It can affect almost every organ in the body. Smoking puts you and those around you at risk for developing many serious chronic  diseases. Quitting smoking can be difficult, but it is one of the best things that you can do for your health. It is never too late to quit. How do I get ready to quit? When you decide to quit smoking, create a plan to help you succeed. Before you quit:  Pick a date to quit. Set a date within the next 2 weeks to give you time to prepare.  Write down the reasons why you are quitting. Keep this list in places where you will see it often.  Tell your family, friends, and co-workers that you are quitting. Support from your loved ones can make quitting easier.  Talk with your health care provider about your options for quitting smoking.  Find out what treatment options are covered by your health insurance.  Identify people, places, things, and activities that make you want to smoke (triggers). Avoid them. What first steps can I take to quit smoking?  Throw away all cigarettes at home, at work, and in your car.  Throw away smoking accessories, such as Scientist, research (medical).  Clean your car. Make sure to empty the ashtray.  Clean your home, including curtains and carpets. What strategies can I use to quit smoking? Talk with your health care provider about combining strategies, such as taking medicines while you are also receiving in-person counseling. Using these two strategies together makes you more likely to succeed in quitting than if you used either strategy on its own.  If you are pregnant or breastfeeding, talk with your health care provider about finding counseling or other support strategies to quit smoking. Do not take medicine to help you quit smoking unless your health care provider tells you to do so. To quit smoking: Quit right away  Quit smoking completely, instead of gradually reducing how much you smoke over a period of time. Research shows that stopping smoking right away is more successful than gradually quitting.  Attend in-person counseling to help you build  problem-solving skills. You are more likely to succeed in quitting if you attend counseling sessions regularly. Even short sessions of 10 minutes can be effective. Take medicine You may take medicines to help you quit smoking. Some medicines require a prescription and some you can purchase over-the-counter. Medicines may have nicotine in them to replace the  nicotine in cigarettes. Medicines may:  Help to stop cravings.  Help to relieve withdrawal symptoms. Your health care provider may recommend:  Nicotine patches, gum, or lozenges.  Nicotine inhalers or sprays.  Non-nicotine medicine that is taken by mouth. Find resources Find resources and support systems that can help you to quit smoking and remain smoke-free after you quit. These resources are most helpful when you use them often. They include:  Online chats with a Social worker.  Telephone quitlines.  Printed Furniture conservator/restorer.  Support groups or group counseling.  Text messaging programs.  Mobile phone apps or applications. Use apps that can help you stick to your quit plan by providing reminders, tips, and encouragement. There are many free apps for mobile devices as well as websites. Examples include Quit Guide from the [employer] and smokefree.gov What things can I do to make it easier to quit?   Reach out to your family and friends for support and encouragement. Call telephone quitlines (1-800-QUIT-NOW), reach out to support groups, or work with a counselor for support.  Ask people who smoke to avoid smoking around you.  Avoid places that trigger you to smoke, such as bars, parties, or smoke-break areas at work.  Spend time with people who do not smoke.  Lessen the stress in your life. Stress can be a smoking trigger for some people. To lessen stress, try: ? Exercising regularly. ? Doing deep-breathing exercises. ? Doing yoga. ? Meditating. ? Performing a body scan. This involves closing your eyes, scanning your body from  head to toe, and noticing which parts of your body are particularly tense. Try to relax the muscles in those areas. How will I feel when I quit smoking? Day 1 to 3 weeks Within the first 24 hours of quitting smoking, you may start to feel withdrawal symptoms. These symptoms are usually most noticeable 2-3 days after quitting, but they usually do not last for more than 2-3 weeks. You may experience these symptoms:  Mood swings.  Restlessness, anxiety, or irritability.  Trouble concentrating.  Dizziness.  Strong cravings for sugary foods and nicotine.  Mild weight gain.  Constipation.  Nausea.  Coughing or a sore throat.  Changes in how the medicines that you take for unrelated issues work in your body.  Depression.  Trouble sleeping (insomnia). Week 3 and afterward After the first 2-3 weeks of quitting, you may start to notice more positive results, such as:  Improved sense of smell and taste.  Decreased coughing and sore throat.  Slower heart rate.  Lower blood pressure.  Clearer skin.  The ability to breathe more easily.  Fewer sick days. Quitting smoking can be very challenging. Do not get discouraged if you are not successful the first time. Some people need to make many attempts to quit before they achieve long-term success. Do your best to stick to your quit plan, and talk with your health care provider if you have any questions or concerns. Summary  Smoking tobacco is the leading cause of preventable death. Quitting smoking is one of the best things that you can do for your health.  When you decide to quit smoking, create a plan to help you succeed.  Quit smoking right away, not slowly over a period of time.  When you start quitting, seek help from your health care provider, family, or friends. This information is not intended to replace advice given to you by your health care provider. Make sure you discuss any questions you have with your health  care  provider. Document Revised: 03/29/2019 Document Reviewed: 09/22/2018 Elsevier Patient Education  [employer].

## 2020-05-12 ENCOUNTER — Ambulatory Visit: Payer: Medicare Other | Attending: Internal Medicine

## 2020-05-12 DIAGNOSIS — Z23 Encounter for immunization: Secondary | ICD-10-CM

## 2020-05-12 NOTE — Progress Notes (Signed)
   Covid-19 Vaccination Clinic  Name:  DISA RIEDLINGER    MRN: [id number] DOB: 02/16/1956  05/12/2020  Ms. Grills was observed post Covid-19 immunization for 15 minutes without incident. She was provided with Vaccine Information Sheet and instruction to access the V-Safe system.   Ms. Ege was instructed to call 911 with any severe reactions post vaccine: Marland Kitchen Difficulty breathing  . Swelling of face and throat  . A fast heartbeat  . A bad rash all over body  . Dizziness and weakness

## 2020-05-12 NOTE — Progress Notes (Signed)
Pt unable to stay for 30 mins.    No symptoms on discharge   Driscilla Moats  MD

## 2020-05-15 ENCOUNTER — Ambulatory Visit: Payer: Medicare Other | Admitting: Obstetrics and Gynecology

## 2020-06-16 ENCOUNTER — Telehealth: Payer: Self-pay | Admitting: *Deleted

## 2020-06-16 NOTE — Telephone Encounter (Signed)
06/16/20- [place] [id number]-  The site received an email from the study regarding new study results.  Dr. Lindi Adie was notified of the study notice this afternoon, and he asked the nurse to call the pt with the new information. The research nurse attempted to reach the pt by phone to discuss the new update on the ABC study.  The pt was not available by phone, and the nurse left a message asking the pt to return the nurse's call this afternoon before 5 pm or tomorrow morning.  The nurse informed the pt that there was an important change to the study, and the nurse needs to inform her of this new information.  The research nurse will await the pt's return call.  Brion Aliment RN, BSN, CCRP Clinical Research Nurse 06/16/2020 4:52 PM

## 2020-06-17 ENCOUNTER — Telehealth: Payer: Self-pay | Admitting: *Deleted

## 2020-06-17 NOTE — Telephone Encounter (Signed)
06/17/2020 at 9:14am - [facility] [id number]/ABC study notes- The pt returned the nurse's call from 06/16/20.  The pt was informed that she needs to stop taking her study drug (aspirin/placebo) effective today.  The nurse used the sample letter that is available on the [facility] [phone] study page on the member side of the [facility] website as well as the [facility] website as a reference to explain why the decision was made to stop the study.  The pt was told that the study will unblind the study drug, and all participants will be notified if they were receiving the study drug, aspirin, or the placebo.  The pt was instructed to return all of her unused/remaining aspirin or placebo pills and medication logs to their study staff for reconciliation.  The pt was told that she can return the medication and her logs at her next study visit in January 2022.  The pt said that she would prefer to drop her medication and logs by the cancer center next week.  She said that she has another appointment near the cancer center and would prefer to return them.  The pt was encouraged to call Dr. Lindi Adie or her research nurse if she has any questions/concerns.  The pt was informed that she will be notified of any further information.  The pt verbalized understanding. The pt was thanked for all of her compliance/support of this study. Brion Aliment RN, BSN, CCRP Clinical Research Nurse 06/17/2020 9:41 AM

## 2020-06-18 ENCOUNTER — Encounter: Payer: Self-pay | Admitting: *Deleted

## 2020-06-18 NOTE — Progress Notes (Signed)
06/18/2020 at 11:38am - ABC study/[facility] [id number]- The pt dropped off her study drug bottle along with her Study Medication Logs at the [facility] this morning.  The pt was notified earlier in the week that the study was stopped.  The pt was told that she needed to return her remaining aspirin or placebo pills and medication logs to the study staff for reconciliation.  The pt returned her medication logs for August 2021 through December 1st, 2021.  The pt's last dose of study drug was on 06/16/20. The pt's drug bottle was taken to the pharmacy for the drug accountability/reconciliation check by Raul Del, research pharmacist.  The pharmacist was told that "once reconciled, the leftover pills should be destroyed per institutional policies".  The pt returned 92 pills.   Brion Aliment RN, BSN, CCRP  Clinical Research Nurse 06/18/2020 11:48 AM

## 2020-08-03 NOTE — Assessment & Plan Note (Signed)
Bilateral mastectomies 10/27/2015: Right mastectomy: IDC grade 1, 2 foci, largest measures 1.8 cm, 2/6 lymph nodes positive T1cN1 stage II a, ER/PR positive HER-2 negative Ki-67 3%; left mastectomy: [facility]; Mammaprint low risk, luminal type A (did not need chemotherapy) Adjuvant radiation therapy from 12/09/2015 to 01/27/2016   Current treatment: Adjuvant antiestrogen therapy with anastrozole 1 mg by mouth daily switched to Letrozole 05/04/16 due to musculoskeletal Sx,switched to exemestane January 2020. But her insurance is not covering it so we switched her back to anastrozole 1 mg daily 01/24/2019  Anastrozole toxicities: 1.Hot flashes: Mild to moderate being monitored 2.Joint aches and pains: Patient takes magnesium 3.Dry skin: Encouraged increasing fluid intake and moisturizers  ABC clinical trial: mild bruising denies any stomach upset or bleeding symptoms. Epigastric discomfort: Suspect gastritis most likely related to recent steroid use as well as her current antibiotics. We would characterize this as grade 2 Our recommendation will be to hold aspirin until the symptoms get better. We will also try to prescribe a proton pump inhibitor.  We are checking to see if the study can cover for such a medication.  Breast cancer surveillance: 1.Breast exam 01/27/2020: Benign 2.Mammogram not necessary since she had bilateral mastectomies. No clinical evidence of disease recurrence.  Return to clinic in 6 months for follow-up

## 2020-08-03 NOTE — Progress Notes (Addendum)
Patient Care Team: Shirline Frees, MD as PCP - General (Family Medicine) Kem Boroughs, [place] as Nurse Practitioner (Family Medicine) Sylvan Cheese, NP as Nurse Practitioner (Hematology and Oncology)  DIAGNOSIS:    ICD-10-CM   1. Malignant neoplasm of upper-outer quadrant of right breast in female, estrogen receptor positive ([place])  C50.411    Z17.0     SUMMARY OF ONCOLOGIC HISTORY: Oncology History  Breast cancer of upper-outer quadrant of right female breast ([place])  09/04/2015 Initial Diagnosis   Right breast biopsy invasive ductal carcinoma with papillary features, grade 1, ER/PR positive HER-2 negative Ki-67 3%; 6 month follow-up mamm: stable left breast calcifications but new right breast calcifications 2 sets 2 mm size (only one set biopsied)   10/27/2015 Surgery   Bilateral mastectomies [name]), right mastectomy: IDC grade 1, 2 foci, largest measures 1.8 cm, 2/6 lymph nodes positive T1cN1 stage II a, ER/PR positive HER-2 negative Ki-67 3%; left mastectomy: ALH.   10/27/2015 Oncotype testing   Mammaprint: Low risk, Luminal-type    12/09/2015 - 01/27/2016 Radiation Therapy   Adjuvant radiation therapy ([name]). Right chest wall, 50.4 Gy at 28 fractions.  Right chest/mastectomy scar boost, 10 Gy at 5 fractions    02/2016 -  Anti-estrogen oral therapy   Anastrozole 1 mg daily; switched to letrozole 2.5 mg daily 05/04/2016 due to myalgias and arthralgias; switched to exemestane 08/10/18 due to severe hot flashes   08/12/2016 Miscellaneous   ABC clinical trial (aspirin versus placebo)     CHIEF COMPLIANT: Follow-up of right breast cancer on exemestane  INTERVAL HISTORY: Chelsea Patterson is a 65 y.o. with above-mentioned history of right breast cancer treated with bilateral mastectomies,radiation,and isnow on antiestrogen therapy withexemestane.She was a participant in the ABC clinical trial.She presents to the clinic todayfor follow-up. She stopped aspirin in  July 2021.  She has not had any further bruising or stomach upset issues.  Denies any lumps or nodules in the chest wall or axilla.  She has had neck problems for which she is undergoing dry needling which has helped her tremendously.  ALLERGIES:  is allergic to wellbutrin [bupropion], latex, and prozac [fluoxetine hcl].  MEDICATIONS:  Current Outpatient Medications  Medication Sig Dispense Refill  . acetaminophen (TYLENOL ARTHRITIS PAIN) 650 MG CR tablet Take 2,600 mg by mouth every 8 (eight) hours as needed for pain. Reported on 12/22/2015    . acyclovir (ZOVIRAX) 400 MG tablet acyclovir 400 mg tablet  TAKE 1 TABLET BY MOUTH TWICE A DAY    . anastrozole (ARIMIDEX) 1 MG tablet Take 1 tablet (1 mg total) by mouth daily. 90 tablet 3  . atorvastatin (LIPITOR) 40 MG tablet Take 40 mg by mouth daily at 6 PM.     . b complex vitamins tablet Take 1 tablet by mouth daily.    . cholecalciferol (VITAMIN D) 1000 UNITS tablet Take by mouth daily. Takes between 2-4    . clobetasol cream (TEMOVATE) 0.05 % SMARTSIG:1 Topical Every Night    . Collagen-Vitamin C (COLLAGEN PLUS VITAMIN C PO) Take 1 tablet by mouth daily. Reported on 12/22/2015    . diphenhydramine-acetaminophen (TYLENOL PM) 25-500 MG TABS tablet Take 1 tablet by mouth at bedtime.     Marland Kitchen escitalopram (LEXAPRO) 20 MG tablet Take 20 mg by mouth daily.     . fish oil-omega-3 fatty acids 1000 MG capsule Take 2 g by mouth daily.    . hydroquinone 4 % cream Apply topically.    . Investigational aspirin/placebo 300 MG  tablet [facility] [phone] Take 1 tablet by mouth daily. Take with food or a full glass of water.  Do not crush enteric coated tablets. 180 tablet 0  . Multiple Vitamin (MULTIVITAMIN) tablet Take 1 tablet by mouth daily.    . Pantoprazole Sodium (INVESTIGATIONAL PANTOPRAZOLE) 40 MG tablet [facility] [id number] Take 1 tablet (40 mg total) by mouth daily. (Patient not taking: Reported on 05/08/2020) 180 tablet 0  . Polyethyl Glycol-Propyl Glycol  (SYSTANE OP) Place 1-2 drops into both eyes as needed (for dry eyes).    Marland Kitchen tiotropium (SPIRIVA) 18 MCG inhalation capsule Place 18 mcg into inhaler and inhale daily.    Marland Kitchen tiZANidine (ZANAFLEX) 2 MG tablet Take 1 tablet (2 mg total) by mouth every 8 (eight) hours as needed for muscle spasms.    . traMADol (ULTRAM) 50 MG tablet Take 1 tablet (50 mg total) by mouth every 12 (twelve) hours as needed. 30 tablet   . XOPENEX HFA 45 MCG/ACT inhaler Inhale 2 puffs into the lungs every 4 (four) hours as needed for wheezing or shortness of breath.      No current facility-administered medications for this visit.    PHYSICAL EXAMINATION: ECOG PERFORMANCE STATUS: 1 - Symptomatic but completely ambulatory  Vitals:   08/04/20 1143  BP: 132/79  Pulse: (!) 102  Resp: 18  Temp: 98.1 F (36.7 C)  SpO2: 100%   Filed Weights   08/04/20 1143  Weight: 130 lb (59 kg)      LABORATORY DATA:  I have reviewed the data as listed CMP Latest Ref Rng & Units 10/27/2015 10/27/2015 10/08/2015  Glucose 65 - 99 mg/dL - 96 89  BUN 6 - 20 mg/dL - 11 11  Creatinine 0.44 - 1.00 mg/dL 0.78 0.84 0.87  Sodium 135 - 145 mmol/L - 144 141  Potassium 3.5 - 5.1 mmol/L - 4.6 4.8  Chloride 101 - 111 mmol/L - 109 106  CO2 22 - 32 mmol/L - 24 28  Calcium 8.9 - 10.3 mg/dL - 9.4 9.6  Total Protein 6.5 - 8.1 g/dL - 6.5 6.7  Total Bilirubin 0.3 - 1.2 mg/dL - 0.5 0.5  Alkaline Phos 38 - 126 U/L - 54 59  AST 15 - 41 U/L - 23 34  ALT 14 - 54 U/L - 16 38    Lab Results  Component Value Date   WBC 14.2 (H) 10/27/2015   HGB 11.2 (L) 10/27/2015   HCT 33.7 (L) 10/27/2015   MCV 96.3 10/27/2015   PLT 249 10/27/2015   NEUTROABS 4.2 10/27/2015    ASSESSMENT & PLAN:  Breast cancer of upper-outer quadrant of right female breast ([place]) Bilateral mastectomies 10/27/2015: Right mastectomy: IDC grade 1, 2 foci, largest measures 1.8 cm, 2/6 lymph nodes positive T1cN1 stage II a, ER/PR positive HER-2 negative Ki-67 3%; left mastectomy:  AL[facility]; Mammaprint low risk, luminal type A (did not need chemotherapy) Adjuvant radiation therapy from 12/09/2015 to 01/27/2016   Current treatment: Adjuvant antiestrogen therapy with anastrozole 1 mg by mouth daily switched to Letrozole 05/04/16 due to musculoskeletal Sx,switched to exemestane January 2020. But her insurance is not covering it so we switched her back to anastrozole 1 mg daily 01/24/2019  Anastrozole toxicities: 1.Hot flashes: Mild to moderate being monitored 2.Joint aches and pains: Patient takes magnesium 3.Dry skin: Encouraged increasing fluid intake and moisturizers  ABC clinical trial: Unblinded and patient was found to be on aspirin.  Stopped study drug on 06/16/2020 Epigastric discomfort: Resolved Chronic neck pain: Dry needling is helping  her.  Breast cancer surveillance: 1.Breast exam 01/27/2020: Benign 2.Mammogram not necessary since she had bilateral mastectomies. No clinical evidence of disease recurrence.  Return to clinic in 6 months for follow-up    No orders of the defined types were placed in this encounter.  The patient has a good understanding of the overall plan. she agrees with it. she will call with any problems that may develop before the next visit here.  Total time spent: 20 mins including face to face time and time spent for planning, charting and coordination of care  Nicholas Lose, MD 08/04/2020  I, Cloyde Reams Dorshimer, am acting as scribe for Dr. Nicholas Lose.  I have reviewed the above documentation for accuracy and completeness, and I agree with the above.

## 2020-08-04 ENCOUNTER — Other Ambulatory Visit: Payer: Self-pay

## 2020-08-04 ENCOUNTER — Inpatient Hospital Stay: Payer: Medicare Other | Attending: Hematology and Oncology | Admitting: Hematology and Oncology

## 2020-08-04 DIAGNOSIS — M542 Cervicalgia: Secondary | ICD-10-CM | POA: Insufficient documentation

## 2020-08-04 DIAGNOSIS — C50411 Malignant neoplasm of upper-outer quadrant of right female breast: Secondary | ICD-10-CM | POA: Diagnosis not present

## 2020-08-04 DIAGNOSIS — Z923 Personal history of irradiation: Secondary | ICD-10-CM | POA: Insufficient documentation

## 2020-08-04 DIAGNOSIS — N951 Menopausal and female climacteric states: Secondary | ICD-10-CM | POA: Insufficient documentation

## 2020-08-04 DIAGNOSIS — Z170 Estrogen receptor positive status [ER+]: Secondary | ICD-10-CM

## 2020-08-04 DIAGNOSIS — Z9013 Acquired absence of bilateral breasts and nipples: Secondary | ICD-10-CM | POA: Insufficient documentation

## 2020-08-04 DIAGNOSIS — Z006 Encounter for examination for normal comparison and control in clinical research program: Secondary | ICD-10-CM | POA: Diagnosis not present

## 2020-08-04 DIAGNOSIS — G8929 Other chronic pain: Secondary | ICD-10-CM | POA: Insufficient documentation

## 2020-08-04 DIAGNOSIS — Z79811 Long term (current) use of aromatase inhibitors: Secondary | ICD-10-CM | POA: Insufficient documentation

## 2020-08-04 MED ORDER — ANASTROZOLE 1 MG PO TABS: 1.0000 mg | ORAL_TABLET | Freq: Every day | ORAL | 3 refills | Status: DC

## 2020-08-04 NOTE — Research (Signed)
[facility] [id number] ABC - 82 MONTH VISIT  08/04/2020 11:45AM Chelsea Patterson is unaccompanied today; she is seen by this research nurse in the exam room prior to her visit with Dr. Lindi Adie.  STUDY LETTER AND UNBLINDING RESULTS: [name] verbalizes awareness that the study have permanently closed and returned her study medication at an earlier visit: there will be no dispensing this visit. She is provided with a copy of the study patient letter dated 06/16/20. Chelsea Patterson is advised that according to the unblinding report, she was receiving aspirin (not placebo) and she is pleased but not at all surprised to hear that since she experienced some GI upset during her course of study treatment.  ADVERSE EVENTS AND NSAID USE: Chelsea Patterson denies any adverse events other than ongoing, intermittent bruising on her left arm that remains unchanged from previous grade (grade 1). She denies GI bleeding, intracranial hemorrhage, epistaxis, hematuria, dyspepsia, or gastritis. She reports taking the study-provided pantoprazole for approximately one week after receipt: she guesses her last date taking pantoprazole was on or around 02/15/20.  She continues to have intermittent neck pain but states her neck is "much better" and currently is having no issues or pain. Chelsea Patterson reports, "I was talking Tylenol, sometimes as often as six to eight times a day," during a recent bout of neck pain (in November 2011) but is currently does not require anything for neck pain.  MD LETTER: Dr. Lindi Adie is provided with a copy of the study physician letter dated 06/16/20. He will schedule a six-month visit for July. Chelsea Patterson is advised that should the study discontinue the six-month visits that someone in the research department will reach out to her and let her know: she verbalizes understanding.  Dionne Bucy. Sharlett Iles, BSN, RN, CIC 08/04/2020 12:28 PM

## 2020-09-01 DIAGNOSIS — E7800 Pure hypercholesterolemia, unspecified: Secondary | ICD-10-CM | POA: Diagnosis not present

## 2020-09-01 DIAGNOSIS — J449 Chronic obstructive pulmonary disease, unspecified: Secondary | ICD-10-CM | POA: Diagnosis not present

## 2020-09-01 DIAGNOSIS — C50411 Malignant neoplasm of upper-outer quadrant of right female breast: Secondary | ICD-10-CM | POA: Diagnosis not present

## 2020-09-01 DIAGNOSIS — M542 Cervicalgia: Secondary | ICD-10-CM | POA: Diagnosis not present

## 2020-09-01 DIAGNOSIS — F17200 Nicotine dependence, unspecified, uncomplicated: Secondary | ICD-10-CM | POA: Diagnosis not present

## 2020-09-01 DIAGNOSIS — N1831 Chronic kidney disease, stage 3a: Secondary | ICD-10-CM | POA: Diagnosis not present

## 2020-09-01 DIAGNOSIS — Z23 Encounter for immunization: Secondary | ICD-10-CM | POA: Diagnosis not present

## 2020-11-13 ENCOUNTER — Telehealth: Payer: Self-pay | Admitting: *Deleted

## 2020-11-13 NOTE — Telephone Encounter (Signed)
[facility] [id number]- A Randomized Phase III Double Blinded Placebo Controlled Trial of Aspirin as Adjuvant Therapy for HER2 Negative Breast Cancer:  The ABC Trial  Research nurse received a memo from the ABC Study requesting that all final ABC study visits be completed by 12/30/20.  This pt's final visit was currently scheduled for 02/04/21.  The research nurse notified the pt's doctor of the need to move this pt's appt to June for protocol compliance reasons.  The pt was notified, and she was in agreement to be seen in June.  Therefore, the pt's final ABC study visit will be done prior to 12/30/20.  The pt states she has been doing well in the interim since her last visit.  The pt was thanked for her willingness to move her appt date/time.   Nikki L. Eldreth RN, BSN, CCRP Clinical Research Nurse Lead 11/13/2020 2:31 PM  

## 2020-11-16 ENCOUNTER — Telehealth: Payer: Self-pay | Admitting: Hematology and Oncology

## 2020-11-16 NOTE — Telephone Encounter (Signed)
R/s appt per 4/29 sch msg. Pt aware.  

## 2020-12-17 ENCOUNTER — Encounter: Payer: Self-pay | Admitting: Hematology and Oncology

## 2020-12-23 NOTE — Progress Notes (Signed)
Patient Care Team: Shirline Frees, MD as PCP - General (Family Medicine) Kem Boroughs, [place] as Nurse Practitioner (Family Medicine) Sylvan Cheese, NP as Nurse Practitioner (Hematology and Oncology)  DIAGNOSIS:    ICD-10-CM   1. Malignant neoplasm of upper-outer quadrant of right breast in female, estrogen receptor positive ([place])  C50.411    Z17.0       SUMMARY OF ONCOLOGIC HISTORY: Oncology History  Breast cancer of upper-outer quadrant of right female breast ([place])  09/04/2015 Initial Diagnosis   Right breast biopsy invasive ductal carcinoma with papillary features, grade 1, ER/PR positive HER-2 negative Ki-67 3%; 6 month follow-up mamm: stable left breast calcifications but new right breast calcifications 2 sets 2 mm size (only one set biopsied)    10/27/2015 Surgery   Bilateral mastectomies [name]), right mastectomy: IDC grade 1, 2 foci, largest measures 1.8 cm, 2/6 lymph nodes positive T1cN1 stage II a, ER/PR positive HER-2 negative Ki-67 3%; left mastectomy: AL[facility].   10/27/2015 Oncotype testing   Mammaprint: Low risk, Luminal-type     12/09/2015 - 01/27/2016 Radiation Therapy   Adjuvant radiation therapy ([name]). Right chest wall, 50.4 Gy at 28 fractions.  Right chest/mastectomy scar boost, 10 Gy at 5 fractions    02/2016 -  Anti-estrogen oral therapy   Anastrozole 1 mg daily; switched to letrozole 2.5 mg daily 05/04/2016 due to myalgias and arthralgias; switched to exemestane 08/10/18 due to severe hot flashes   08/12/2016 Miscellaneous   ABC clinical trial (aspirin versus placebo)      CHIEF COMPLIANT: Follow-up of right breast cancer on anastrozole  INTERVAL HISTORY: Chelsea Patterson is a 65 y.o. with above-mentioned history of right breast cancer treated with bilateral mastectomies, radiation, and is now on antiestrogen therapy with exemestane. She was a participant in the ABC clinical trial. She presents to the clinic today for follow-up.  Since she  stopped to the aspirin clinical trial, her stomach upset has resolved.  She no longer has acidity and stomach irritation.  Bruising is also resolved.  She does continue to have joint stiffness and achiness especially when she sits for long period of time.  Denies any pain lumps or nodules in the chest wall or axilla.  ALLERGIES:  is allergic to wellbutrin [bupropion], latex, and prozac [fluoxetine hcl].  MEDICATIONS:  Current Outpatient Medications  Medication Sig Dispense Refill   acetaminophen (TYLENOL ARTHRITIS PAIN) 650 MG CR tablet Take 2,600 mg by mouth every 8 (eight) hours as needed for pain. Reported on 12/22/2015     acyclovir (ZOVIRAX) 400 MG tablet acyclovir 400 mg tablet  TAKE 1 TABLET BY MOUTH TWICE A DAY     anastrozole (ARIMIDEX) 1 MG tablet Take 1 tablet (1 mg total) by mouth daily. 90 tablet 3   atorvastatin (LIPITOR) 40 MG tablet Take 40 mg by mouth daily at 6 PM.      b complex vitamins tablet Take 1 tablet by mouth daily.     cholecalciferol (VITAMIN D) 1000 UNITS tablet Take by mouth daily. Takes between 2-4     clobetasol cream (TEMOVATE) 0.05 % SMARTSIG:1 Topical Every Night     Collagen-Vitamin C (COLLAGEN PLUS VITAMIN C PO) Take 1 tablet by mouth daily. Reported on 12/22/2015     diphenhydramine-acetaminophen (TYLENOL PM) 25-500 MG TABS tablet Take 1 tablet by mouth at bedtime.      escitalopram (LEXAPRO) 20 MG tablet Take 20 mg by mouth daily.      fish oil-omega-3 fatty acids 1000 MG capsule  Take 2 g by mouth daily.     hydroquinone 4 % cream Apply topically.     Investigational aspirin/placebo 300 MG tablet [facility] [id number] Take 1 tablet by mouth daily. Take with food or a full glass of water.  Do not crush enteric coated tablets. 180 tablet 0   Multiple Vitamin (MULTIVITAMIN) tablet Take 1 tablet by mouth daily.     Pantoprazole Sodium (INVESTIGATIONAL PANTOPRAZOLE) 40 MG tablet [facility] [id number] Take 1 tablet (40 mg total) by mouth daily. (Patient not taking: Reported  on 05/08/2020) 180 tablet 0   Polyethyl Glycol-Propyl Glycol (SYSTANE OP) Place 1-2 drops into both eyes as needed (for dry eyes).     tiotropium (SPIRIVA) 18 MCG inhalation capsule Place 18 mcg into inhaler and inhale daily.     tiZANidine (ZANAFLEX) 2 MG tablet Take 1 tablet (2 mg total) by mouth every 8 (eight) hours as needed for muscle spasms.     traMADol (ULTRAM) 50 MG tablet Take 1 tablet (50 mg total) by mouth every 12 (twelve) hours as needed. 30 tablet    XOPENEX HFA 45 MCG/ACT inhaler Inhale 2 puffs into the lungs every 4 (four) hours as needed for wheezing or shortness of breath.      No current facility-administered medications for this visit.    PHYSICAL EXAMINATION: ECOG PERFORMANCE STATUS: 1 - Symptomatic but completely ambulatory  Vitals:   12/24/20 1039  BP: 119/71  Pulse: 90  Resp: 18  Temp: 97.9 F (36.6 C)  SpO2: 99%   Filed Weights   12/24/20 1039  Weight: 130 lb 8 oz (59.2 kg)    BREAST: No palpable masses or nodules in either right or left breasts. No palpable axillary supraclavicular or infraclavicular adenopathy no breast tenderness or nipple discharge. (exam performed in the presence of a chaperone)  LABORATORY DATA:  I have reviewed the data as listed CMP Latest Ref Rng & Units 10/27/2015 10/27/2015 10/08/2015  Glucose 65 - 99 mg/dL - 96 89  BUN 6 - 20 mg/dL - 11 11  Creatinine 0.44 - 1.00 mg/dL 0.78 0.84 0.87  Sodium 135 - 145 mmol/L - 144 141  Potassium 3.5 - 5.1 mmol/L - 4.6 4.8  Chloride 101 - 111 mmol/L - 109 106  CO2 22 - 32 mmol/L - 24 28  Calcium 8.9 - 10.3 mg/dL - 9.4 9.6  Total Protein 6.5 - 8.1 g/dL - 6.5 6.7  Total Bilirubin 0.3 - 1.2 mg/dL - 0.5 0.5  Alkaline Phos 38 - 126 U/L - 54 59  AST 15 - 41 U/L - 23 34  ALT 14 - 54 U/L - 16 38    Lab Results  Component Value Date   WBC 14.2 (H) 10/27/2015   HGB 11.2 (L) 10/27/2015   HCT 33.7 (L) 10/27/2015   MCV 96.3 10/27/2015   PLT 249 10/27/2015   NEUTROABS 4.2 10/27/2015     ASSESSMENT & PLAN:  Breast cancer of upper-outer quadrant of right female breast ([place]) Bilateral mastectomies 10/27/2015: Right mastectomy: IDC grade 1, 2 foci, largest measures 1.8 cm, 2/6 lymph nodes positive T1cN1 stage II a, ER/PR positive HER-2 negative Ki-67 3%; left mastectomy: AL[facility]; Mammaprint low risk, luminal type A (did not need chemotherapy) Adjuvant radiation therapy from 12/09/2015 to 01/27/2016    Current treatment: Adjuvant antiestrogen therapy with anastrozole 1 mg by mouth daily switched to Letrozole 05/04/16 due to musculoskeletal Sx, switched to exemestane January 2020.  But her insurance is not covering it so we switched her back  to anastrozole 1 mg daily 01/24/2019, plan to treat her for 7 years.   2 more years left.   Anastrozole toxicities: 1.  Hot flashes: Mild to moderate being monitored 2.  Joint aches and pains: Patient takes magnesium 3.  Dry skin: Encouraged increasing fluid intake and moisturizers   ABC clinical trial: Unblinded and patient was found to be on aspirin.  Stopped study drug on 06/16/2020 Epigastric discomfort: Resolved Chronic neck pain: Dry needling is helping her.   Breast cancer surveillance: 1.  Breast exam 12/24/20: Benign 2.  Mammogram: Not indicated since she had bilateral mastectomies. No clinical evidence of disease recurrence.   Return to clinic in 1 year for follow-up    No orders of the defined types were placed in this encounter.  The patient has a good understanding of the overall plan. she agrees with it. she will call with any problems that may develop before the next visit here.  Total time spent: 20 mins including face to face time and time spent for planning, charting and coordination of care  Rulon Eisenmenger, MD, MPH 12/24/2020  I, Cloyde Reams Dorshimer, am acting as scribe for Dr. Nicholas Lose.  I have reviewed the above documentation for accuracy and completeness, and I agree with the above.

## 2020-12-23 NOTE — Assessment & Plan Note (Signed)
Bilateral mastectomies 10/27/2015: Right mastectomy: IDC grade 1, 2 foci, largest measures 1.8 cm, 2/6 lymph nodes positive T1cN1 stage II a, ER/PR positive HER-2 negative Ki-67 3%; left mastectomy: AL[facility]; Mammaprint low risk, luminal type A (did not need chemotherapy) Adjuvant radiation therapy from 12/09/2015 to 01/27/2016   Current treatment: Adjuvant antiestrogen therapy with anastrozole 1 mg by mouth daily switched to Letrozole 05/04/16 due to musculoskeletal Sx,switched to exemestane January 2020. But her insurance is not covering it so we switchedher back to anastrozole 1 mg daily 01/24/2019  Anastrozole toxicities: 1.Hot flashes: Mild to moderate being monitored 2.Joint aches and pains: Patient takes magnesium 3.Dry skin: Encouraged increasing fluid intake and moisturizers  ABC clinical trial: Unblinded and patient was found to be on aspirin.  Stopped study drug on 06/16/2020 Epigastric discomfort: Resolved Chronic neck pain: Dry needling is helping her.  Breast cancer surveillance: 1.Breast exam6/9/22: Benign 2.Mammogram: Not indicated since she had bilateral mastectomies. No clinical evidence of disease recurrence.  Return to clinic in1 year for follow-up

## 2020-12-24 ENCOUNTER — Encounter: Payer: Self-pay | Admitting: *Deleted

## 2020-12-24 ENCOUNTER — Inpatient Hospital Stay: Payer: Medicare Other | Attending: Hematology and Oncology | Admitting: Hematology and Oncology

## 2020-12-24 ENCOUNTER — Other Ambulatory Visit: Payer: Self-pay

## 2020-12-24 DIAGNOSIS — Z923 Personal history of irradiation: Secondary | ICD-10-CM

## 2020-12-24 DIAGNOSIS — Z9013 Acquired absence of bilateral breasts and nipples: Secondary | ICD-10-CM | POA: Diagnosis not present

## 2020-12-24 DIAGNOSIS — M542 Cervicalgia: Secondary | ICD-10-CM | POA: Insufficient documentation

## 2020-12-24 DIAGNOSIS — G8929 Other chronic pain: Secondary | ICD-10-CM | POA: Insufficient documentation

## 2020-12-24 DIAGNOSIS — Z79811 Long term (current) use of aromatase inhibitors: Secondary | ICD-10-CM | POA: Diagnosis not present

## 2020-12-24 DIAGNOSIS — C50411 Malignant neoplasm of upper-outer quadrant of right female breast: Secondary | ICD-10-CM | POA: Diagnosis not present

## 2020-12-24 DIAGNOSIS — Z170 Estrogen receptor positive status [ER+]: Secondary | ICD-10-CM

## 2020-12-24 DIAGNOSIS — Z006 Encounter for examination for normal comparison and control in clinical research program: Secondary | ICD-10-CM

## 2020-12-24 DIAGNOSIS — N951 Menopausal and female climacteric states: Secondary | ICD-10-CM

## 2020-12-24 DIAGNOSIS — M2550 Pain in unspecified joint: Secondary | ICD-10-CM | POA: Insufficient documentation

## 2020-12-24 NOTE — Research (Signed)
[facility] [id number] ABC -  37 MONTH VISIT/ Final AE and Recurrence status   12/24/2020 11:25AM Chelsea Patterson is unaccompanied today; she is seen by this research nurse in the exam room prior to her visit with Dr. Lindi Adie.   [facility] [phone] Off Treatment Data Needed memo dated 11/30/2020-  The memo stated that one visit is required for each patient after stopping study drug to assess AE's and recurrence status, and this visit should be completed by 12/30/20.  The memo also stated that the study is expected to close for all follow up in summer 2022 as soon as all Off Treatment forms and final AE/recurrence assessments have been completed.     ADVERSE EVENTS AND NSAID USE: Chelsea Patterson denies the following adverse events since her last visit in January 2022:  GI bleeding, intracranial hemorrhage, epistaxis, hematuria, dyspepsia, gastritis and bruising. She states she still has ongoing joint aches/muscle stiffness along with neck pain.  Dr. Lindi Adie felt that these AE's were directly related to her Lipitor and Arimidex. The pt said that she takes only Tylenol Arthritis.  She said that Dr. Kenton Kingfisher prescribed Ultram which she takes occasionally for pain that is not relieved by Tylenol.  She denies any aspirin and NSAID use since her last follow up visit.     Recurrence Status:  Dr. Lindi Adie examined the pt.  His note states "no clinical evidence of disease recurrence'.  The pt was informed to contact Dr. Lindi Adie in the future if she feels any lumps/nodules in her chest wall or axilla.  The pt verbalized understanding.    The pt was thanked for her support and participation in this clinical trial. The pt had no questions or concerns about her study participation.  The pt was encouraged to call the research nurse in the future for any research questions.  The pt will be followed yearly by Dr. Lindi Adie.  Brion Aliment RN, BSN, CCRP Clinical Research Nurse Lead 12/24/2020 12:06 PM

## 2021-01-11 DIAGNOSIS — M25551 Pain in right hip: Secondary | ICD-10-CM | POA: Diagnosis not present

## 2021-01-11 DIAGNOSIS — M5416 Radiculopathy, lumbar region: Secondary | ICD-10-CM | POA: Diagnosis not present

## 2021-02-04 ENCOUNTER — Ambulatory Visit: Payer: Medicare Other | Admitting: Hematology and Oncology

## 2021-03-09 DIAGNOSIS — Z0000 Encounter for general adult medical examination without abnormal findings: Secondary | ICD-10-CM | POA: Diagnosis not present

## 2021-03-09 DIAGNOSIS — J449 Chronic obstructive pulmonary disease, unspecified: Secondary | ICD-10-CM | POA: Diagnosis not present

## 2021-03-09 DIAGNOSIS — M542 Cervicalgia: Secondary | ICD-10-CM | POA: Diagnosis not present

## 2021-03-09 DIAGNOSIS — Z122 Encounter for screening for malignant neoplasm of respiratory organs: Secondary | ICD-10-CM | POA: Diagnosis not present

## 2021-03-09 DIAGNOSIS — E7800 Pure hypercholesterolemia, unspecified: Secondary | ICD-10-CM | POA: Diagnosis not present

## 2021-03-09 DIAGNOSIS — C50411 Malignant neoplasm of upper-outer quadrant of right female breast: Secondary | ICD-10-CM | POA: Diagnosis not present

## 2021-03-09 DIAGNOSIS — Z23 Encounter for immunization: Secondary | ICD-10-CM | POA: Diagnosis not present

## 2021-03-11 DIAGNOSIS — M85851 Other specified disorders of bone density and structure, right thigh: Secondary | ICD-10-CM | POA: Diagnosis not present

## 2021-03-11 DIAGNOSIS — M85852 Other specified disorders of bone density and structure, left thigh: Secondary | ICD-10-CM | POA: Diagnosis not present

## 2021-03-19 ENCOUNTER — Other Ambulatory Visit: Payer: Self-pay | Admitting: *Deleted

## 2021-03-19 DIAGNOSIS — Z87891 Personal history of nicotine dependence: Secondary | ICD-10-CM

## 2021-03-19 DIAGNOSIS — F17210 Nicotine dependence, cigarettes, uncomplicated: Secondary | ICD-10-CM

## 2021-03-29 DIAGNOSIS — K219 Gastro-esophageal reflux disease without esophagitis: Secondary | ICD-10-CM | POA: Diagnosis not present

## 2021-03-29 DIAGNOSIS — N1831 Chronic kidney disease, stage 3a: Secondary | ICD-10-CM | POA: Diagnosis not present

## 2021-03-29 DIAGNOSIS — E7800 Pure hypercholesterolemia, unspecified: Secondary | ICD-10-CM | POA: Diagnosis not present

## 2021-03-29 DIAGNOSIS — J449 Chronic obstructive pulmonary disease, unspecified: Secondary | ICD-10-CM | POA: Diagnosis not present

## 2021-03-31 ENCOUNTER — Telehealth (INDEPENDENT_AMBULATORY_CARE_PROVIDER_SITE_OTHER): Payer: Medicare Other | Admitting: Acute Care

## 2021-03-31 ENCOUNTER — Encounter: Payer: Self-pay | Admitting: Acute Care

## 2021-03-31 ENCOUNTER — Other Ambulatory Visit: Payer: Self-pay

## 2021-03-31 DIAGNOSIS — F17210 Nicotine dependence, cigarettes, uncomplicated: Secondary | ICD-10-CM

## 2021-03-31 NOTE — Progress Notes (Signed)
Virtual Visit via Video Note  I connected with Chelsea Patterson on 03/31/21 at 10:30 AM EDT by a video enabled telemedicine application and verified that I am speaking with the correct person using two identifiers.  Location: Patient: At home Provider: [place], [place], [place], [address]    I discussed the limitations of evaluation and management by telemedicine and the availability of in person appointments. The patient expressed understanding and agreed to proceed.   Shared Decision Making Visit Lung Cancer Screening Program [id number])   Eligibility: Age 65 y.o. Pack Years Smoking History Calculation 37 pack year smoking history (# packs/per year x # years smoked) Recent History of coughing up blood  no Unexplained weight loss? no ( >Than 15 pounds within the last 6 months ) Prior History Lung / other cancer no (Diagnosis within the last 5 years already requiring surveillance chest CT Scans). Smoking Status Current Smoker Former Smokers: Years since quit: NA  Quit Date: NA  Visit Components: Discussion included one or more decision making aids. yes Discussion included risk/benefits of screening. yes Discussion included potential follow up diagnostic testing for abnormal scans. yes Discussion included meaning and risk of over diagnosis. yes Discussion included meaning and risk of False Positives. yes Discussion included meaning of total radiation exposure. yes  Counseling Included: Importance of adherence to annual lung cancer LDCT screening. yes Impact of comorbidities on ability to participate in the program. yes Ability and willingness to under diagnostic treatment. yes  Smoking Cessation Counseling: Current Smokers:  Discussed importance of smoking cessation. yes Information about tobacco cessation classes and interventions provided to patient. yes Patient provided with "ticket" for LDCT Scan. yes Symptomatic Patient. no  Counseling NA Diagnosis Code:  Tobacco Use Z72.0 Asymptomatic Patient yes  Counseling (Intermediate counseling: > three minutes counseling) [id number] Former Smokers:  Discussed the importance of maintaining cigarette abstinence. yes Diagnosis Code: Personal History of Nicotine Dependence. [id number] Information about tobacco cessation classes and interventions provided to patient. Yes Patient provided with "ticket" for LDCT Scan. yes Written Order for Lung Cancer Screening with LDCT placed in Epic. Yes (CT Chest Lung Cancer Screening Low Dose W/O CM) [id number] Z12.2-Screening of respiratory organs Z87.891-Personal history of nicotine dependence  I have spent 25 minutes of face to face time with [name] discussing the risks and benefits of lung cancer screening. We viewed a power point together that explained in detail the above noted topics. We paused at intervals to allow for questions to be asked and answered to ensure understanding.We discussed that the single most powerful action that she can take to decrease her risk of developing lung cancer is to quit smoking. We discussed whether or not she is ready to commit to setting a quit date. We discussed options for tools to aid in quitting smoking including nicotine replacement therapy, non-nicotine medications, support groups, Quit Smart classes, and behavior modification. We discussed that often times setting smaller, more achievable goals, such as eliminating 1 cigarette a day for a week and then 2 cigarettes a day for a week can be helpful in slowly decreasing the number of cigarettes smoked. This allows for a sense of accomplishment as well as providing a clinical benefit. I gave her the " Be Stronger Than Your Excuses" card with contact information for community resources, classes, free nicotine replacement therapy, and access to mobile apps, text messaging, and on-line smoking cessation help. I have also given her my card and contact information in the event she needs to contact  me. We discussed the time and location of the scan, and that either Doroteo Glassman RN or I will call with the results within 24-48 hours of receiving them. I have offered her  a copy of the power point we viewed  as a resource in the event they need reinforcement of the concepts we discussed today in the office. The patient verbalized understanding of all of  the above and had no further questions upon leaving the office. They have my contact information in the event they have any further questions.  I spent 3 minutes counseling on smoking cessation and the health risks of continued tobacco abuse.  I explained to the patient that there has been a high incidence of coronary artery disease noted on these exams. I explained that this is a non-gated exam therefore degree or severity cannot be determined. This patient is currently on statin therapy. I have asked the patient to follow-up with their PCP regarding any incidental finding of coronary artery disease and management with diet or medication as their PCP  feels is clinically indicated. The patient verbalized understanding of the above and had no further questions upon completion of the visit.      Magdalen Spatz, NP 03/31/2021

## 2021-03-31 NOTE — Patient Instructions (Signed)
Thank you for participating in the [facility] Screening [facility]. It was our pleasure to meet you today. We will call you with the results of your scan within the next few days. Your scan will be assigned a Lung RADS category score by the physicians reading the scans.  This Lung RADS score determines follow up scanning.  See below for description of categories, and follow up screening recommendations. We will be in touch to schedule your follow up screening annually or based on recommendations of our providers. We will fax a copy of your scan results to your Primary Care Physician, or the physician who referred you to the program, to ensure they have the results. Please call the office if you have any questions or concerns regarding your scanning experience or results.  Our office number is [phone]. Please speak with Denise Phelps, RN. She is our Lung Cancer Screening RN. If she is unavailable when you call, please have the office staff send her a message. She will return your call at her earliest convenience. Remember, if your scan is normal, we will scan you annually as long as you continue to meet the criteria for the program. (Age 55-77, Current smoker or smoker who has quit within the last 15 years). If you are a smoker, remember, quitting is the single most powerful action that you can take to decrease your risk of lung cancer and other pulmonary, breathing related problems. We know quitting is hard, and we are here to help.  Please let us know if there is anything we can do to help you meet your goal of quitting. If you are a former smoker, congratulations. We are proud of you! Remain smoke free! Remember you can refer friends or family members through the number above.  We will screen them to make sure they meet criteria for the program. Thank you for helping us take better care of you by participating in Lung Screening.  Lung RADS Categories:  Lung RADS 1: no nodules  or definitely non-concerning nodules.  Recommendation is for a repeat annual scan in 12 months.  Lung RADS 2:  nodules that are non-concerning in appearance and behavior with a very low likelihood of becoming an active cancer. Recommendation is for a repeat annual scan in 12 months.  Lung RADS 3: nodules that are probably non-concerning , includes nodules with a low likelihood of becoming an active cancer.  Recommendation is for a 6-month repeat screening scan. Often noted after an upper respiratory illness. We will be in touch to make sure you have no questions, and to schedule your 6-month scan.  Lung RADS 4 A: nodules with concerning findings, recommendation is most often for a follow up scan in 3 months or additional testing based on our provider's assessment of the scan. We will be in touch to make sure you have no questions and to schedule the recommended 3 month follow up scan.  Lung RADS 4 B:  indicates findings that are concerning. We will be in touch with you to schedule additional diagnostic testing based on our provider's  assessment of the scan.   

## 2021-04-01 ENCOUNTER — Other Ambulatory Visit: Payer: Self-pay

## 2021-04-01 ENCOUNTER — Ambulatory Visit (INDEPENDENT_AMBULATORY_CARE_PROVIDER_SITE_OTHER)
Admission: RE | Admit: 2021-04-01 | Discharge: 2021-04-01 | Disposition: A | Discharge status: Home / Self Care | Payer: Medicare Other | Source: Ambulatory Visit | Attending: Acute Care | Admitting: Acute Care

## 2021-04-01 DIAGNOSIS — Z87891 Personal history of nicotine dependence: Secondary | ICD-10-CM | POA: Diagnosis not present

## 2021-04-01 DIAGNOSIS — F17210 Nicotine dependence, cigarettes, uncomplicated: Secondary | ICD-10-CM

## 2021-04-05 ENCOUNTER — Telehealth: Payer: Self-pay | Admitting: Acute Care

## 2021-04-05 DIAGNOSIS — Z87891 Personal history of nicotine dependence: Secondary | ICD-10-CM

## 2021-04-05 DIAGNOSIS — F17210 Nicotine dependence, cigarettes, uncomplicated: Secondary | ICD-10-CM

## 2021-04-05 NOTE — Telephone Encounter (Signed)
Pt informed of CT results per Sarah Groce, NP.  PT verbalized understanding.  Copy of CT sent to PCP.  Order placed for 1 yr f/u CT. ° °

## 2021-04-14 DIAGNOSIS — L570 Actinic keratosis: Secondary | ICD-10-CM | POA: Diagnosis not present

## 2021-04-14 DIAGNOSIS — D225 Melanocytic nevi of trunk: Secondary | ICD-10-CM | POA: Diagnosis not present

## 2021-04-14 DIAGNOSIS — B070 Plantar wart: Secondary | ICD-10-CM | POA: Diagnosis not present

## 2021-04-14 DIAGNOSIS — M1811 Unilateral primary osteoarthritis of first carpometacarpal joint, right hand: Secondary | ICD-10-CM | POA: Diagnosis not present

## 2021-04-14 DIAGNOSIS — L814 Other melanin hyperpigmentation: Secondary | ICD-10-CM | POA: Diagnosis not present

## 2021-04-14 DIAGNOSIS — L821 Other seborrheic keratosis: Secondary | ICD-10-CM | POA: Diagnosis not present

## 2021-04-14 DIAGNOSIS — D1801 Hemangioma of skin and subcutaneous tissue: Secondary | ICD-10-CM | POA: Diagnosis not present

## 2021-04-14 DIAGNOSIS — G5603 Carpal tunnel syndrome, bilateral upper limbs: Secondary | ICD-10-CM | POA: Diagnosis not present

## 2021-04-14 DIAGNOSIS — M180 Bilateral primary osteoarthritis of first carpometacarpal joints: Secondary | ICD-10-CM | POA: Diagnosis not present

## 2021-04-14 DIAGNOSIS — M65311 Trigger thumb, right thumb: Secondary | ICD-10-CM | POA: Diagnosis not present

## 2021-04-14 DIAGNOSIS — Z85828 Personal history of other malignant neoplasm of skin: Secondary | ICD-10-CM | POA: Diagnosis not present

## 2021-04-21 DIAGNOSIS — M542 Cervicalgia: Secondary | ICD-10-CM | POA: Diagnosis not present

## 2021-05-07 ENCOUNTER — Encounter: Payer: Self-pay | Admitting: Hematology and Oncology

## 2021-05-31 ENCOUNTER — Encounter: Payer: Self-pay | Admitting: *Deleted

## 2021-05-31 ENCOUNTER — Ambulatory Visit: Payer: Medicare Other | Admitting: Cardiology

## 2021-05-31 ENCOUNTER — Encounter: Payer: Self-pay | Admitting: Cardiology

## 2021-05-31 ENCOUNTER — Other Ambulatory Visit: Payer: Self-pay

## 2021-05-31 VITALS — BP 110/70 | HR 99 | Ht 67.0 in | Wt 132.0 lb

## 2021-05-31 DIAGNOSIS — E782 Mixed hyperlipidemia: Secondary | ICD-10-CM

## 2021-05-31 DIAGNOSIS — I2510 Atherosclerotic heart disease of native coronary artery without angina pectoris: Secondary | ICD-10-CM | POA: Insufficient documentation

## 2021-05-31 DIAGNOSIS — I700 Atherosclerosis of aorta: Secondary | ICD-10-CM | POA: Diagnosis not present

## 2021-05-31 DIAGNOSIS — I208 Other forms of angina pectoris: Secondary | ICD-10-CM | POA: Diagnosis not present

## 2021-05-31 DIAGNOSIS — Z720 Tobacco use: Secondary | ICD-10-CM

## 2021-05-31 DIAGNOSIS — E785 Hyperlipidemia, unspecified: Secondary | ICD-10-CM | POA: Diagnosis not present

## 2021-05-31 DIAGNOSIS — I2584 Coronary atherosclerosis due to calcified coronary lesion: Secondary | ICD-10-CM | POA: Diagnosis not present

## 2021-05-31 NOTE — Assessment & Plan Note (Signed)
Discussed at length.  She has tried several different tactics.  After showing her her images of her proximal LAD, discussing her fear of not being there for her son who currently lives in a group home, I encouraged her strongly to push forward with tobacco cessation and believe deeply that she can do this.

## 2021-05-31 NOTE — Assessment & Plan Note (Signed)
She has been on atorvastatin for 20 years.  She does state that she has leg pain from this.  When she has stopped the atorvastatin in the past the leg pain is improved.  She continues to take it however.  I would like for her to sit down with the lipid clinic and discuss potential alternatives especially PCSK9 inhibitors etc.  Her most current LDL on atorvastatin 40 mg is 82.  LDL goal is less than 70 and perhaps even lower given her family history and known coronary artery disease with dense LAD calcification/plaque.  I am hesitant to increase her dose to 80 mg of atorvastatin given her current leg discomfort.
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
## 2021-05-31 NOTE — Patient Instructions (Signed)
Medication Instructions:  The current medical regimen is effective;  continue present plan and medications.  *If you need a refill on your cardiac medications before your next appointment, please call your pharmacy*  Testing/Procedures: Your physician has requested that you have a lexiscan myoview. For further information please visit [email or web address]. Please follow instruction sheet, as given.  You have been referred to see our pharmacist in the [facility] here at [facility].  Follow-Up: At [facility], you and your health needs are our priority.  As part of our continuing mission to provide you with exceptional heart care, we have created designated Provider Care Teams.  These Care Teams include your primary Cardiologist (physician) and Advanced Practice Providers (APPs -  Physician Assistants and Nurse Practitioners) who all work together to provide you with the care you need, when you need it.  We recommend signing up for the patient portal called "MyChart".  Sign up information is provided on this After Visit Summary.  MyChart is used to connect with patients for Virtual Visits (Telemedicine).  Patients are able to view lab/test results, encounter notes, upcoming appointments, etc.  Non-urgent messages can be sent to your provider as well.   To learn more about what you can do with MyChart, go to [email or web address].    Your next appointment:   1 year(s)  The format for your next appointment:   In Person  Provider:   Candee Furbish, MD     Thank you for choosing [facility]!!

## 2021-05-31 NOTE — Assessment & Plan Note (Signed)
Atorvastatin 40 mg.  Blood pressure control.  Former smoker.  Diet, exercise.

## 2021-05-31 NOTE — Assessment & Plan Note (Addendum)
Personally reviewed and interpreted on lung cancer screening CT scan.  Fairly dense LAD calcification noted in the proximal region.  She is at increased risk for heart attack.  Personally showed her images.  Currently on atorvastatin 40 mg a day.  She states that while hiking going up hills she has had more shortness of breath.  Occasionally will feel a skipped or pause like sensation when exercising on the elliptical machine.  With her shortness of breath, this could be an anginal equivalent.  With her coronary artery disease currently present, we will check a pharmacologic stress test to ensure that she does not have any high risk ischemia in the LAD territory.  We will follow-up with results of study.

## 2021-06-08 ENCOUNTER — Encounter (HOSPITAL_COMMUNITY): Payer: Self-pay | Admitting: *Deleted

## 2021-06-08 ENCOUNTER — Telehealth (HOSPITAL_COMMUNITY): Payer: Self-pay | Admitting: *Deleted

## 2021-06-08 NOTE — Telephone Encounter (Signed)
Left message on voicemail per DPR in reference to upcoming appointment scheduled on 06/16/21 at 0800 with detailed instructions given per Myocardial Perfusion Study Information Sheet for the test. LM to arrive 15 minutes early, and that it is imperative to arrive on time for appointment to keep from having the test rescheduled. If you need to cancel or reschedule your appointment, please call the office within 24 hours of your appointment. Failure to do so may result in a cancellation of your appointment, and a $50 no show fee. Phone number given for call back for any questions. Mychart letter sent.Abri Vacca, Ranae Palms
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
## 2021-06-16 ENCOUNTER — Ambulatory Visit (HOSPITAL_COMMUNITY): Payer: Medicare Other | Attending: Cardiology

## 2021-06-16 ENCOUNTER — Other Ambulatory Visit: Payer: Self-pay

## 2021-06-16 ENCOUNTER — Ambulatory Visit: Payer: Medicare Other | Admitting: Pharmacist

## 2021-06-16 DIAGNOSIS — E782 Mixed hyperlipidemia: Secondary | ICD-10-CM | POA: Diagnosis not present

## 2021-06-16 DIAGNOSIS — E785 Hyperlipidemia, unspecified: Secondary | ICD-10-CM

## 2021-06-16 DIAGNOSIS — I208 Other forms of angina pectoris: Secondary | ICD-10-CM | POA: Diagnosis not present

## 2021-06-16 LAB — MYOCARDIAL PERFUSION IMAGING
LV dias vol: 57 mL (ref 46–106)
LV sys vol: 23 mL
Nuc Stress EF: 60 %
Peak HR: 134 {beats}/min
Rest HR: 93 {beats}/min
Rest Nuclear Isotope Dose: 9.1 mCi
SDS: 2
SRS: 0
SSS: 2
ST Depression (mm): 0 mm
Stress Nuclear Isotope Dose: 32.6 mCi
TID: 1.04

## 2021-06-16 MED ORDER — TECHNETIUM TC 99M TETROFOSMIN IV KIT
9.1000 | PACK | Freq: Once | INTRAVENOUS | Status: AC | PRN
  Administered 2021-06-16: 9.1 via INTRAVENOUS
  Filled 2021-06-16: qty 10

## 2021-06-16 MED ORDER — ROSUVASTATIN CALCIUM 20 MG PO TABS: 20.0000 mg | ORAL_TABLET | Freq: Every day | ORAL | 3 refills | Status: DC

## 2021-06-16 MED ORDER — TECHNETIUM TC 99M TETROFOSMIN IV KIT
32.6000 | PACK | Freq: Once | INTRAVENOUS | Status: AC | PRN
  Administered 2021-06-16: 32.6 via INTRAVENOUS
  Filled 2021-06-16: qty 33

## 2021-06-16 MED ORDER — REGADENOSON 0.4 MG/5ML IV SOLN
0.4000 mg | Freq: Once | INTRAVENOUS | Status: AC
  Administered 2021-06-16: 0.4 mg via INTRAVENOUS

## 2021-06-16 NOTE — Patient Instructions (Addendum)
Stop taking your atorvastatin. Monitor for improvement in your leg pain. Once your leg pain improves hopefully over the next 1-2 weeks, start taking rosuvastatin (Crestor) 20mg  - 1 tablet once daily  Recheck fasting cholesterol January 23rd any time after 7:30am  Call Jinny Blossom, PharmD with any trouble tolerating your rosuvastatin and we can try decreasing the dose or frequency. [phone]

## 2021-06-21 ENCOUNTER — Telehealth: Payer: Self-pay | Admitting: Cardiology

## 2021-06-21 NOTE — Telephone Encounter (Signed)
Overall reassuring stress test that was low risk.  No evidence of significant ischemia identified.  Normal pulm function. Candee Furbish, MD

## 2021-06-21 NOTE — Telephone Encounter (Signed)
Follow Up:.     Patient said she saw her Stress Test results on My Chart, but did not understand it. She would like for somebody to explain it to her please.

## 2021-06-21 NOTE — Telephone Encounter (Signed)
Called pt and reviewed results with her.  Advised there is no evidence of significant decrease in blood flow to her heart tissue based on these results and Dr Marlou Porch review.  Advised to continue to take statin medication, exercise and eat a low fat, high fiber diet.  She states understanding and is aware to follow up in 1 year.

## 2021-07-21 DIAGNOSIS — M1811 Unilateral primary osteoarthritis of first carpometacarpal joint, right hand: Secondary | ICD-10-CM | POA: Diagnosis not present

## 2021-07-21 DIAGNOSIS — M180 Bilateral primary osteoarthritis of first carpometacarpal joints: Secondary | ICD-10-CM | POA: Diagnosis not present

## 2021-07-21 DIAGNOSIS — M65311 Trigger thumb, right thumb: Secondary | ICD-10-CM | POA: Diagnosis not present

## 2021-08-02 ENCOUNTER — Other Ambulatory Visit: Payer: Self-pay | Admitting: Hematology and Oncology

## 2021-08-09 ENCOUNTER — Other Ambulatory Visit: Payer: Self-pay

## 2021-08-09 ENCOUNTER — Other Ambulatory Visit: Payer: Medicare Other

## 2021-08-09 DIAGNOSIS — E785 Hyperlipidemia, unspecified: Secondary | ICD-10-CM | POA: Diagnosis not present

## 2021-08-09 LAB — HEPATIC FUNCTION PANEL
ALT: 12 IU/L (ref 0–32)
AST: 18 IU/L (ref 0–40)
Albumin: 4.4 g/dL (ref 3.8–4.8)
Alkaline Phosphatase: 82 IU/L (ref 44–121)
Bilirubin Total: 0.3 mg/dL (ref 0.0–1.2)
Bilirubin, Direct: <0.1 mg/dL (ref 0.00–0.40)
Total Protein: 6.6 g/dL (ref 6.0–8.5)

## 2021-08-09 LAB — LIPID PANEL
Chol/HDL Ratio: 3.7 ratio (ref 0.0–4.4)
Cholesterol, Total: 151 mg/dL (ref 100–199)
HDL: 41 mg/dL (ref >39)
LDL Chol Calc (NIH): 76 mg/dL (ref 0–99)
Triglycerides: 206 mg/dL — ABNORMAL HIGH (ref 0–149)
VLDL Cholesterol Cal: 34 mg/dL (ref 5–40)

## 2021-08-10 ENCOUNTER — Telehealth: Payer: Self-pay | Admitting: *Deleted

## 2021-08-10 DIAGNOSIS — E782 Mixed hyperlipidemia: Secondary | ICD-10-CM

## 2021-08-10 DIAGNOSIS — Z79899 Other long term (current) drug therapy: Secondary | ICD-10-CM

## 2021-08-10 MED ORDER — EZETIMIBE 10 MG PO TABS: 10.0000 mg | ORAL_TABLET | Freq: Every day | ORAL | 3 refills | Status: DC

## 2021-08-10 NOTE — Telephone Encounter (Signed)
LDL 76, goal <70 Add Zetia 10mg  once a day Repeat lipids in 3 months.  Candee Furbish, MD  Written by Jerline Pain, MD on 08/10/2021  6:50 AM EST  Pt is aware of results and orders.  Zetia to be sent electronically to [facility] as requested. Orders for Lipids/ALT placed and scheduled 11/17/2021.

## 2021-09-09 DIAGNOSIS — C50411 Malignant neoplasm of upper-outer quadrant of right female breast: Secondary | ICD-10-CM | POA: Diagnosis not present

## 2021-09-09 DIAGNOSIS — E7800 Pure hypercholesterolemia, unspecified: Secondary | ICD-10-CM | POA: Diagnosis not present

## 2021-09-09 DIAGNOSIS — M542 Cervicalgia: Secondary | ICD-10-CM | POA: Diagnosis not present

## 2021-09-09 DIAGNOSIS — J449 Chronic obstructive pulmonary disease, unspecified: Secondary | ICD-10-CM | POA: Diagnosis not present

## 2021-09-09 DIAGNOSIS — K219 Gastro-esophageal reflux disease without esophagitis: Secondary | ICD-10-CM | POA: Diagnosis not present

## 2021-11-12 DIAGNOSIS — M542 Cervicalgia: Secondary | ICD-10-CM | POA: Diagnosis not present

## 2021-11-17 ENCOUNTER — Other Ambulatory Visit: Payer: Medicare Other | Admitting: *Deleted

## 2021-11-17 DIAGNOSIS — E782 Mixed hyperlipidemia: Secondary | ICD-10-CM

## 2021-11-17 DIAGNOSIS — Z79899 Other long term (current) drug therapy: Secondary | ICD-10-CM

## 2021-11-17 LAB — LIPID PANEL
Chol/HDL Ratio: 3.2 ratio (ref 0.0–4.4)
Cholesterol, Total: 108 mg/dL (ref 100–199)
HDL: 34 mg/dL — ABNORMAL LOW (ref >39)
LDL Chol Calc (NIH): 52 mg/dL (ref 0–99)
Triglycerides: 123 mg/dL (ref 0–149)
VLDL Cholesterol Cal: 22 mg/dL (ref 5–40)

## 2021-11-17 LAB — ALT: ALT: 13 IU/L (ref 0–32)

## 2021-11-25 ENCOUNTER — Telehealth: Payer: Self-pay | Admitting: Hematology and Oncology

## 2021-11-25 NOTE — Telephone Encounter (Signed)
Rescheduled appointment per provider PAL. Left message. 

## 2021-12-24 ENCOUNTER — Ambulatory Visit: Payer: Medicare Other | Admitting: Hematology and Oncology

## 2021-12-28 NOTE — Progress Notes (Incomplete)
Patient Care Team: Shirline Frees, MD as PCP - General (Family Medicine) Jerline Pain, MD as PCP - Cardiology (Cardiology) Kem Boroughs, FNP as Nurse Practitioner (Family Medicine) Sylvan Cheese, NP as Nurse Practitioner (Hematology and Oncology)  DIAGNOSIS: No diagnosis found.  SUMMARY OF ONCOLOGIC HISTORY: Oncology History  Breast cancer of upper-outer quadrant of right female breast ([place])  09/04/2015 Initial Diagnosis   Right breast biopsy invasive ductal carcinoma with papillary features, grade 1, ER/PR positive HER-2 negative Ki-67 3%; 6 month follow-up mamm: stable left breast calcifications but new right breast calcifications 2 sets 2 mm size (only one set biopsied)   10/27/2015 Surgery   Bilateral mastectomies [name]), right mastectomy: IDC grade 1, 2 foci, largest measures 1.8 cm, 2/6 lymph nodes positive T1cN1 stage II a, ER/PR positive HER-2 negative Ki-67 3%; left mastectomy: ALH.   10/27/2015 Oncotype testing   Mammaprint: Low risk, Luminal-type    12/09/2015 - 01/27/2016 Radiation Therapy   Adjuvant radiation therapy ([name]). Right chest wall, 50.4 Gy at 28 fractions.  Right chest/mastectomy scar boost, 10 Gy at 5 fractions    02/2016 -  Anti-estrogen oral therapy   Anastrozole 1 mg daily; switched to letrozole 2.5 mg daily 05/04/2016 due to myalgias and arthralgias; switched to exemestane 08/10/18 due to severe hot flashes   08/12/2016 Miscellaneous   ABC clinical trial (aspirin versus placebo)     CHIEF COMPLIANT: Follow-up of right breast cancer on anastrozole  INTERVAL HISTORY: Chelsea Patterson is a 66 y.o. with above-mentioned history of right breast cancer currently on anastrozole. She presents to the clinic today for follow-up.    ALLERGIES:  is allergic to wellbutrin [bupropion], latex, and prozac [fluoxetine hcl].  MEDICATIONS:  Current Outpatient Medications  Medication Sig Dispense Refill   acetaminophen (TYLENOL) 650 MG CR tablet  Take 2,600 mg by mouth every 8 (eight) hours as needed for pain. Reported on 12/22/2015     acyclovir (ZOVIRAX) 400 MG tablet acyclovir 400 mg tablet  TAKE 1 TABLET BY MOUTH TWICE A DAY     anastrozole (ARIMIDEX) 1 MG tablet TAKE 1 TABLET BY MOUTH  DAILY 90 tablet 3   b complex vitamins tablet Take 1 tablet by mouth daily.     cholecalciferol (VITAMIN D) 1000 UNITS tablet Take by mouth daily. Takes between 2-4     clobetasol cream (TEMOVATE) 0.05 % SMARTSIG:1 Topical Every Night     Collagen-Vitamin C (COLLAGEN PLUS VITAMIN C PO) Take 1 tablet by mouth daily. Reported on 12/22/2015     diphenhydramine-acetaminophen (TYLENOL PM) 25-500 MG TABS tablet Take 1 tablet by mouth at bedtime.      escitalopram (LEXAPRO) 20 MG tablet Take 20 mg by mouth daily.      ezetimibe (ZETIA) 10 MG tablet Take 1 tablet (10 mg total) by mouth daily. 90 tablet 3   fish oil-omega-3 fatty acids 1000 MG capsule Take 2 g by mouth daily.     hydroquinone 4 % cream Apply topically.     Multiple Vitamin (MULTIVITAMIN) tablet Take 1 tablet by mouth daily.     Polyethyl Glycol-Propyl Glycol (SYSTANE OP) Place 1-2 drops into both eyes as needed (for dry eyes).     rosuvastatin (CRESTOR) 20 MG tablet Take 1 tablet (20 mg total) by mouth daily. 90 tablet 3   tiotropium (SPIRIVA) 18 MCG inhalation capsule Place 18 mcg into inhaler and inhale daily.     traMADol (ULTRAM) 50 MG tablet Take 1 tablet (50 mg total) by mouth  every 12 (twelve) hours as needed. 30 tablet    XOPENEX HFA 45 MCG/ACT inhaler Inhale 2 puffs into the lungs every 4 (four) hours as needed for wheezing or shortness of breath.      No current facility-administered medications for this visit.    PHYSICAL EXAMINATION: ECOG PERFORMANCE STATUS: {CHL ONC ECOG PS:[id number]}  There were no vitals filed for this visit. There were no vitals filed for this visit.  BREAST:*** No palpable masses or nodules in either right or left breasts. No palpable axillary  supraclavicular or infraclavicular adenopathy no breast tenderness or nipple discharge. (exam performed in the presence of a chaperone)  LABORATORY DATA:  I have reviewed the data as listed    Latest Ref Rng & Units 11/17/2021   10:02 AM 08/09/2021   10:25 AM 10/27/2015    4:26 PM  CMP  Creatinine 0.44 - 1.00 mg/dL   0.78   Total Protein 6.0 - 8.5 g/dL  6.6    Total Bilirubin 0.0 - 1.2 mg/dL  0.3    Alkaline Phos 44 - 121 IU/L  82    AST 0 - 40 IU/L  18    ALT 0 - 32 IU/L 13  12      Lab Results  Component Value Date   WBC 14.2 (H) 10/27/2015   HGB 11.2 (L) 10/27/2015   HCT 33.7 (L) 10/27/2015   MCV 96.3 10/27/2015   PLT 249 10/27/2015   NEUTROABS 4.2 10/27/2015    ASSESSMENT & PLAN:  No problem-specific Assessment & Plan notes found for this encounter.    No orders of the defined types were placed in this encounter.  The patient has a good understanding of the overall plan. she agrees with it. she will call with any problems that may develop before the next visit here. Total time spent: 30 mins including face to face time and time spent for planning, charting and co-ordination of care   Suzzette Righter, [place] 12/28/21    I Gardiner Coins am scribing for Dr. Lindi Adie  ***.

## 2022-01-11 ENCOUNTER — Telehealth: Payer: Self-pay | Admitting: Genetic Counselor

## 2022-01-11 ENCOUNTER — Inpatient Hospital Stay: Payer: Medicare Other | Attending: Hematology and Oncology | Admitting: Hematology and Oncology

## 2022-01-11 ENCOUNTER — Other Ambulatory Visit: Payer: Self-pay

## 2022-01-11 VITALS — BP 132/87 | HR 99 | Temp 97.9°F | Resp 18 | Ht 67.0 in | Wt 131.0 lb

## 2022-01-11 DIAGNOSIS — C50411 Malignant neoplasm of upper-outer quadrant of right female breast: Secondary | ICD-10-CM | POA: Diagnosis not present

## 2022-01-11 DIAGNOSIS — Z79811 Long term (current) use of aromatase inhibitors: Secondary | ICD-10-CM | POA: Diagnosis not present

## 2022-01-11 DIAGNOSIS — Z780 Asymptomatic menopausal state: Secondary | ICD-10-CM | POA: Diagnosis not present

## 2022-01-11 DIAGNOSIS — N951 Menopausal and female climacteric states: Secondary | ICD-10-CM | POA: Diagnosis not present

## 2022-01-11 DIAGNOSIS — Z170 Estrogen receptor positive status [ER+]: Secondary | ICD-10-CM

## 2022-01-11 MED ORDER — ANASTROZOLE 1 MG PO TABS: 1.0000 mg | ORAL_TABLET | Freq: Every day | ORAL | 3 refills | Status: DC

## 2022-01-11 NOTE — Telephone Encounter (Signed)
Dr. Pamelia Hoit asked that I call the patient and schedule her in genetics.  LM on VM to this effect.  Left CB instructions.

## 2022-01-12 ENCOUNTER — Other Ambulatory Visit: Payer: Self-pay | Admitting: Hematology and Oncology

## 2022-01-12 DIAGNOSIS — C50411 Malignant neoplasm of upper-outer quadrant of right female breast: Secondary | ICD-10-CM

## 2022-01-13 ENCOUNTER — Other Ambulatory Visit: Payer: Self-pay | Admitting: Genetic Counselor

## 2022-01-13 ENCOUNTER — Other Ambulatory Visit: Payer: Self-pay

## 2022-01-13 ENCOUNTER — Inpatient Hospital Stay: Payer: Medicare Other

## 2022-01-13 ENCOUNTER — Encounter: Payer: Self-pay | Admitting: Genetic Counselor

## 2022-01-13 ENCOUNTER — Inpatient Hospital Stay (HOSPITAL_BASED_OUTPATIENT_CLINIC_OR_DEPARTMENT_OTHER): Payer: Medicare Other | Admitting: Genetic Counselor

## 2022-01-13 DIAGNOSIS — Z853 Personal history of malignant neoplasm of breast: Secondary | ICD-10-CM | POA: Diagnosis not present

## 2022-01-13 DIAGNOSIS — Z170 Estrogen receptor positive status [ER+]: Secondary | ICD-10-CM

## 2022-01-13 DIAGNOSIS — C50411 Malignant neoplasm of upper-outer quadrant of right female breast: Secondary | ICD-10-CM | POA: Diagnosis not present

## 2022-01-13 DIAGNOSIS — Z800 Family history of malignant neoplasm of digestive organs: Secondary | ICD-10-CM | POA: Diagnosis not present

## 2022-01-13 DIAGNOSIS — Z1379 Encounter for other screening for genetic and chromosomal anomalies: Secondary | ICD-10-CM

## 2022-01-13 LAB — GENETIC SCREENING ORDER
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
## 2022-01-14 ENCOUNTER — Encounter: Payer: Self-pay | Admitting: Hematology and Oncology

## 2022-01-14 ENCOUNTER — Encounter: Payer: Self-pay | Admitting: Genetic Counselor

## 2022-01-15 ENCOUNTER — Other Ambulatory Visit: Payer: Self-pay | Admitting: Cardiology

## 2022-01-25 ENCOUNTER — Ambulatory Visit (HOSPITAL_BASED_OUTPATIENT_CLINIC_OR_DEPARTMENT_OTHER)
Admission: RE | Admit: 2022-01-25 | Discharge: 2022-01-25 | Disposition: A | Discharge status: Home / Self Care | Payer: Medicare Other | Source: Ambulatory Visit | Attending: Hematology and Oncology | Admitting: Hematology and Oncology

## 2022-01-25 DIAGNOSIS — M85851 Other specified disorders of bone density and structure, right thigh: Secondary | ICD-10-CM | POA: Diagnosis not present

## 2022-01-25 DIAGNOSIS — Z780 Asymptomatic menopausal state: Secondary | ICD-10-CM | POA: Diagnosis not present

## 2022-02-03 DIAGNOSIS — R1310 Dysphagia, unspecified: Secondary | ICD-10-CM | POA: Diagnosis not present

## 2022-02-03 DIAGNOSIS — K219 Gastro-esophageal reflux disease without esophagitis: Secondary | ICD-10-CM | POA: Diagnosis not present

## 2022-02-03 DIAGNOSIS — J309 Allergic rhinitis, unspecified: Secondary | ICD-10-CM | POA: Diagnosis not present

## 2022-02-04 ENCOUNTER — Telehealth: Payer: Self-pay | Admitting: Genetic Counselor

## 2022-02-04 ENCOUNTER — Encounter: Payer: Self-pay | Admitting: Genetic Counselor

## 2022-02-04 DIAGNOSIS — Z1379 Encounter for other screening for genetic and chromosomal anomalies: Secondary | ICD-10-CM | POA: Insufficient documentation

## 2022-02-04 NOTE — Telephone Encounter (Signed)
I contacted Ms. Giusto to discuss her genetic testing results. No pathogenic variants were identified in the 77 genes analyzed. Detailed clinic note to follow.  The test report has been scanned into EPIC and is located under the Molecular Pathology section of the Results Review tab.  A portion of the result report is included below for reference.   Lucille Passy, MS, [facility] Genetic Counselor [email or web address] (P) [phone]

## 2022-02-07 ENCOUNTER — Ambulatory Visit: Payer: Self-pay | Admitting: Genetic Counselor

## 2022-02-07 DIAGNOSIS — Z1379 Encounter for other screening for genetic and chromosomal anomalies: Secondary | ICD-10-CM

## 2022-02-07 NOTE — Progress Notes (Signed)
HPI:   Chelsea Patterson was previously seen in the [facility] due to a personal and family history of cancer and concerns regarding a hereditary predisposition to cancer. Please refer to our prior cancer genetics clinic note for more information regarding our discussion, assessment and recommendations, at the time. Chelsea Patterson recent genetic test results were disclosed to her, as were recommendations warranted by these results. These results and recommendations are discussed in more detail below.  CANCER HISTORY:  Oncology History  Breast cancer of upper-outer quadrant of right female breast (Chelsea Patterson)  09/04/2015 Initial Diagnosis   Right breast biopsy invasive ductal carcinoma with papillary features, grade 1, ER/PR positive HER-2 negative Ki-67 3%; 6 month follow-up mamm: stable left breast calcifications but new right breast calcifications 2 sets 2 mm size (only one set biopsied)   10/27/2015 Surgery   Bilateral mastectomies Chelsea Patterson), right mastectomy: IDC grade 1, 2 foci, largest measures 1.8 cm, 2/6 lymph nodes positive T1cN1 stage II a, ER/PR positive HER-2 negative Ki-67 3%; left mastectomy: ALH.   10/27/2015 Oncotype testing   Mammaprint: Low risk, Luminal-type    12/09/2015 - 01/27/2016 Radiation Therapy   Adjuvant radiation therapy ([name]). Right chest wall, 50.4 Gy at 28 fractions.  Right chest/mastectomy scar boost, 10 Gy at 5 fractions    02/2016 -  Anti-estrogen oral therapy   Anastrozole 1 mg daily; switched to letrozole 2.5 mg daily 05/04/2016 due to myalgias and arthralgias; switched to exemestane 08/10/18 due to severe hot flashes   08/12/2016 Miscellaneous   ABC clinical trial (aspirin versus placebo)    Genetic Testing   [facility] CancerNext-Expanded Panel was Negative. Report date is 01/31/2022.  The CancerNext-Expanded gene panel offered by [facility] and includes sequencing, rearrangement, and RNA analysis for the following 77 genes: AIP, ALK, APC, ATM,  AXIN2, BAP1, BARD1, BLM, BMPR1A, BRCA1, BRCA2, BRIP1, CDC73, CDH1, CDK4, CDKN1B, CDKN2A, CHEK2, CTNNA1, DICER1, FANCC, FH, FLCN, GALNT12, KIF1B, LZTR1, MAX, MEN1, MET, MLH1, MSH2, MSH3, MSH6, MUTYH, NBN, NF1, NF2, NTHL1, PALB2, PHOX2B, PMS2, POT1, PRKAR1A, PTCH1, PTEN, RAD51C, RAD51D, RB1, RECQL, RET, SDHA, SDHAF2, SDHB, SDHC, SDHD, SMAD4, SMARCA4, SMARCB1, SMARCE1, STK11, SUFU, TMEM127, TP53, TSC1, TSC2, VHL and XRCC2 (sequencing and deletion/duplication); EGFR, EGLN1, HOXB13, KIT, MITF, PDGFRA, POLD1, and POLE (sequencing only); EPCAM and GREM1 (deletion/duplication only).      FAMILY HISTORY:  We obtained a detailed, 4-generation family history.  Significant diagnoses are listed below:      Family History  Problem Relation Age of Onset   Hypertension Mother     Osteoporosis Mother     Hyperlipidemia Mother     Cancer Father 35        Merkel Cell tx. chemo & rad   Hodgkin's lymphoma Brother 37   Colon cancer Maternal Uncle 53 - 59   Stomach cancer Maternal Uncle 69 - 49   Prostate cancer Paternal Uncle 76   Thyroid cancer Maternal Grandmother 82   Heart failure Paternal Grandfather          CVA   Learning disabilities Son          Group Home in [place] 3        maternal first cousin   Stomach cancer Cousin 38        maternal first cousin       Chelsea Patterson's brother was diagnosed with Hodgkin's lymphoma at age 47. Her maternal uncle was diagnosed with colon cancer in his 65s, he died at  age 88. This uncle's daughter was diagnosed with leukemia and died at age 18. A second maternal uncle was diagnosed with stomach cancer in his 63s, he died at age 29, he was exposed to agent orange. This uncle's son was diagnosed with stomach cancer at age 9, he died at age 10. Her maternal grandmother was diagnosed with thyroid cancer at age 67, she died at age 72.    Chelsea Patterson father was diagnosed with Merkel cell carcinoma at age 31 and died at age 6. He also had a  history of basal and squamous cell carcinoma. Her paternal uncle was diagnosed with prostate cancer at age 71. Chelsea Patterson is unaware of previous family history of genetic testing for hereditary cancer risks. There is no reported Ashkenazi Jewish ancestry.  GENETIC TEST RESULTS:  The [facility] CancerNext-Expanded Panel found no pathogenic mutations.   The CancerNext-Expanded gene panel offered by [facility] and includes sequencing, rearrangement, and RNA analysis for the following 77 genes: AIP, ALK, APC, ATM, AXIN2, BAP1, BARD1, BLM, BMPR1A, BRCA1, BRCA2, BRIP1, CDC73, CDH1, CDK4, CDKN1B, CDKN2A, CHEK2, CTNNA1, DICER1, FANCC, FH, FLCN, GALNT12, KIF1B, LZTR1, MAX, MEN1, MET, MLH1, MSH2, MSH3, MSH6, MUTYH, NBN, NF1, NF2, NTHL1, PALB2, PHOX2B, PMS2, POT1, PRKAR1A, PTCH1, PTEN, RAD51C, RAD51D, RB1, RECQL, RET, SDHA, SDHAF2, SDHB, SDHC, SDHD, SMAD4, SMARCA4, SMARCB1, SMARCE1, STK11, SUFU, TMEM127, TP53, TSC1, TSC2, VHL and XRCC2 (sequencing and deletion/duplication); EGFR, EGLN1, HOXB13, KIT, MITF, PDGFRA, POLD1, and POLE (sequencing only); EPCAM and GREM1 (deletion/duplication only).   The test report has been scanned into EPIC and is located under the Molecular Pathology section of the Results Review tab.  A portion of the result report is included below for reference. Genetic testing reported out on 01/31/2022.       Even though a pathogenic variant was not identified, possible explanations for the cancer in the family may include: There may be no hereditary risk for cancer in the family. The cancers in Chelsea Patterson and/or her family may be due to other genetic or environmental factors. There may be a gene mutation in one of these genes that current testing methods cannot detect, but that chance is small. There could be another gene that has not yet been discovered, or that we have not yet tested, that is responsible for the cancer diagnoses in the family.  It is also possible there is a hereditary  cause for the cancer in the family that Chelsea Patterson did not inherit.  Therefore, it is important to remain in touch with cancer genetics in the future so that we can continue to offer Chelsea Patterson the most up to date genetic testing.   ADDITIONAL GENETIC TESTING:  We discussed with Ms. Egger that her genetic testing was fairly extensive.  If there are genes identified to increase cancer risk that can be analyzed in the future, we would be happy to discuss and coordinate this testing at that time.    CANCER SCREENING RECOMMENDATIONS:  Ms. Heinrich test result is considered negative (normal).  This means that we have not identified a hereditary cause for her personal and family history of cancer at this time.   An individual's cancer risk and medical management are not determined by genetic test results alone. Overall cancer risk assessment incorporates additional factors, including personal medical history, family history, and any available genetic information that may result in a personalized plan for cancer prevention and surveillance. Therefore, it is recommended she continue to follow the cancer management and screening guidelines provided  by her oncology and primary healthcare provider.  RECOMMENDATIONS FOR FAMILY MEMBERS:   Since she did not inherit a mutation in a cancer predisposition gene included on this panel, her son could not have inherited a mutation from her in one of these genes. Individuals in this family might be at some increased risk of developing cancer, over the general population risk, due to the family history of cancer. We recommend women in this family have a yearly mammogram beginning at age 42, or 93 years younger than the earliest onset of cancer, an annual clinical breast exam, and perform monthly breast self-exams.  FOLLOW-UP:  Cancer genetics is a rapidly advancing field and it is possible that new genetic tests will be appropriate for her and/or her family  members in the future. We encouraged her to remain in contact with cancer genetics on an annual basis so we can update her personal and family histories and let her know of advances in cancer genetics that may benefit this family.   Our contact number was provided. Ms. Whisnant questions were answered to her satisfaction, and she knows she is welcome to call us at anytime with additional questions or concerns.   Lucille Passy, MS, [facility] Genetic Counselor [email or web address] (P) [phone]

## 2022-02-10 ENCOUNTER — Encounter: Payer: Self-pay | Admitting: Genetic Counselor

## 2022-02-14 DIAGNOSIS — M180 Bilateral primary osteoarthritis of first carpometacarpal joints: Secondary | ICD-10-CM | POA: Diagnosis not present

## 2022-02-14 DIAGNOSIS — M65311 Trigger thumb, right thumb: Secondary | ICD-10-CM | POA: Diagnosis not present

## 2022-03-22 DIAGNOSIS — J449 Chronic obstructive pulmonary disease, unspecified: Secondary | ICD-10-CM | POA: Diagnosis not present

## 2022-03-22 DIAGNOSIS — K219 Gastro-esophageal reflux disease without esophagitis: Secondary | ICD-10-CM | POA: Diagnosis not present

## 2022-03-22 DIAGNOSIS — Z23 Encounter for immunization: Secondary | ICD-10-CM | POA: Diagnosis not present

## 2022-03-22 DIAGNOSIS — Z0000 Encounter for general adult medical examination without abnormal findings: Secondary | ICD-10-CM | POA: Diagnosis not present

## 2022-03-22 DIAGNOSIS — M542 Cervicalgia: Secondary | ICD-10-CM | POA: Diagnosis not present

## 2022-03-22 DIAGNOSIS — C50411 Malignant neoplasm of upper-outer quadrant of right female breast: Secondary | ICD-10-CM | POA: Diagnosis not present

## 2022-03-22 DIAGNOSIS — E7800 Pure hypercholesterolemia, unspecified: Secondary | ICD-10-CM | POA: Diagnosis not present

## 2022-03-26 ENCOUNTER — Other Ambulatory Visit: Payer: Self-pay | Admitting: Cardiology

## 2022-04-01 ENCOUNTER — Ambulatory Visit (HOSPITAL_COMMUNITY)
Admission: RE | Admit: 2022-04-01 | Discharge: 2022-04-01 | Disposition: A | Discharge status: Home / Self Care | Payer: Medicare Other | Source: Ambulatory Visit | Attending: Acute Care | Admitting: Acute Care

## 2022-04-01 DIAGNOSIS — Z122 Encounter for screening for malignant neoplasm of respiratory organs: Secondary | ICD-10-CM | POA: Insufficient documentation

## 2022-04-01 DIAGNOSIS — F17210 Nicotine dependence, cigarettes, uncomplicated: Secondary | ICD-10-CM | POA: Insufficient documentation

## 2022-04-01 DIAGNOSIS — I2510 Atherosclerotic heart disease of native coronary artery without angina pectoris: Secondary | ICD-10-CM | POA: Diagnosis not present

## 2022-04-01 DIAGNOSIS — J479 Bronchiectasis, uncomplicated: Secondary | ICD-10-CM | POA: Insufficient documentation

## 2022-04-01 DIAGNOSIS — J439 Emphysema, unspecified: Secondary | ICD-10-CM | POA: Diagnosis not present

## 2022-04-01 DIAGNOSIS — I700 Atherosclerosis of aorta: Secondary | ICD-10-CM | POA: Insufficient documentation

## 2022-04-01 DIAGNOSIS — Z87891 Personal history of nicotine dependence: Secondary | ICD-10-CM

## 2022-04-11 ENCOUNTER — Other Ambulatory Visit: Payer: Self-pay

## 2022-04-11 DIAGNOSIS — F17210 Nicotine dependence, cigarettes, uncomplicated: Secondary | ICD-10-CM

## 2022-04-11 DIAGNOSIS — Z122 Encounter for screening for malignant neoplasm of respiratory organs: Secondary | ICD-10-CM

## 2022-04-11 DIAGNOSIS — Z87891 Personal history of nicotine dependence: Secondary | ICD-10-CM

## 2022-04-20 ENCOUNTER — Other Ambulatory Visit: Payer: Self-pay | Admitting: Cardiology

## 2022-04-20 DIAGNOSIS — Z85828 Personal history of other malignant neoplasm of skin: Secondary | ICD-10-CM | POA: Diagnosis not present

## 2022-04-20 DIAGNOSIS — L570 Actinic keratosis: Secondary | ICD-10-CM | POA: Diagnosis not present

## 2022-04-20 DIAGNOSIS — L720 Epidermal cyst: Secondary | ICD-10-CM | POA: Diagnosis not present

## 2022-04-20 DIAGNOSIS — D225 Melanocytic nevi of trunk: Secondary | ICD-10-CM | POA: Diagnosis not present

## 2022-04-20 DIAGNOSIS — L814 Other melanin hyperpigmentation: Secondary | ICD-10-CM | POA: Diagnosis not present

## 2022-04-20 DIAGNOSIS — L821 Other seborrheic keratosis: Secondary | ICD-10-CM | POA: Diagnosis not present

## 2022-04-20 DIAGNOSIS — D2271 Melanocytic nevi of right lower limb, including hip: Secondary | ICD-10-CM | POA: Diagnosis not present

## 2022-05-18 DIAGNOSIS — M65312 Trigger thumb, left thumb: Secondary | ICD-10-CM | POA: Diagnosis not present

## 2022-05-18 DIAGNOSIS — M180 Bilateral primary osteoarthritis of first carpometacarpal joints: Secondary | ICD-10-CM | POA: Diagnosis not present

## 2022-05-18 DIAGNOSIS — M65311 Trigger thumb, right thumb: Secondary | ICD-10-CM | POA: Diagnosis not present

## 2022-06-11 ENCOUNTER — Other Ambulatory Visit: Payer: Self-pay | Admitting: Cardiology

## 2022-06-19 ENCOUNTER — Other Ambulatory Visit: Payer: Self-pay | Admitting: Cardiology

## 2022-06-30 ENCOUNTER — Other Ambulatory Visit: Payer: Self-pay | Admitting: Cardiology

## 2022-07-04 ENCOUNTER — Telehealth: Payer: Self-pay | Admitting: Cardiology

## 2022-07-04 DIAGNOSIS — M180 Bilateral primary osteoarthritis of first carpometacarpal joints: Secondary | ICD-10-CM | POA: Diagnosis not present

## 2022-07-04 DIAGNOSIS — G5603 Carpal tunnel syndrome, bilateral upper limbs: Secondary | ICD-10-CM | POA: Diagnosis not present

## 2022-07-04 MED ORDER — EZETIMIBE 10 MG PO TABS: 10.0000 mg | ORAL_TABLET | Freq: Every day | ORAL | 0 refills | Status: DC

## 2022-07-04 NOTE — Telephone Encounter (Signed)
Pt's medication was sent to pt's pharmacy as requested. Confirmation received.  °

## 2022-07-04 NOTE — Telephone Encounter (Signed)
Pt c/o medication issue:  1. Name of Medication: ezetimibe (ZETIA) 10 MG tablet   2. How are you currently taking this medication (dosage and times per day)? Take 1 tablet (10 mg total) by mouth daily.   3. Are you having a reaction (difficulty breathing--STAT)? No   4. What is your medication issue? Patient needs a new 90 day, 3 refills sent to [place], [place]

## 2022-07-05 ENCOUNTER — Other Ambulatory Visit: Payer: Self-pay | Admitting: Cardiology

## 2022-08-23 ENCOUNTER — Ambulatory Visit: Payer: Medicare Other | Attending: Cardiology | Admitting: Cardiology

## 2022-08-23 ENCOUNTER — Encounter: Payer: Self-pay | Admitting: Cardiology

## 2022-08-23 VITALS — BP 116/70 | HR 91 | Ht 67.0 in | Wt 131.0 lb

## 2022-08-23 DIAGNOSIS — E785 Hyperlipidemia, unspecified: Secondary | ICD-10-CM

## 2022-08-23 DIAGNOSIS — I700 Atherosclerosis of aorta: Secondary | ICD-10-CM

## 2022-08-23 DIAGNOSIS — Z79899 Other long term (current) drug therapy: Secondary | ICD-10-CM

## 2022-08-23 DIAGNOSIS — I2510 Atherosclerotic heart disease of native coronary artery without angina pectoris: Secondary | ICD-10-CM | POA: Diagnosis not present

## 2022-08-23 DIAGNOSIS — I2584 Coronary atherosclerosis due to calcified coronary lesion: Secondary | ICD-10-CM | POA: Diagnosis not present

## 2022-08-23 DIAGNOSIS — E782 Mixed hyperlipidemia: Secondary | ICD-10-CM

## 2022-08-23 MED ORDER — ROSUVASTATIN CALCIUM 20 MG PO TABS: 20.0000 mg | ORAL_TABLET | Freq: Every day | ORAL | 3 refills | Status: DC

## 2022-08-23 MED ORDER — EZETIMIBE 10 MG PO TABS: 10.0000 mg | ORAL_TABLET | Freq: Every day | ORAL | 3 refills | Status: DC

## 2022-08-23 NOTE — Patient Instructions (Signed)
Medication Instructions:  The current medical regimen is effective;  continue present plan and medications.  *If you need a refill on your cardiac medications before your next appointment, please call your pharmacy*  Follow-Up: At [facility], you and your health needs are our priority.  As part of our continuing mission to provide you with exceptional heart care, we have created designated Provider Care Teams.  These Care Teams include your primary Cardiologist (physician) and Advanced Practice Providers (APPs -  Physician Assistants and Nurse Practitioners) who all work together to provide you with the care you need, when you need it.  We recommend signing up for the patient portal called "MyChart".  Sign up information is provided on this After Visit Summary.  MyChart is used to connect with patients for Virtual Visits (Telemedicine).  Patients are able to view lab/test results, encounter notes, upcoming appointments, etc.  Non-urgent messages can be sent to your provider as well.   To learn more about what you can do with MyChart, go to [email or web address].    Your next appointment:   1 year(s)  Provider:   Mark Skains, MD      
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
## 2022-09-24 ENCOUNTER — Other Ambulatory Visit: Payer: Self-pay | Admitting: Hematology and Oncology

## 2022-10-05 DIAGNOSIS — M13841 Other specified arthritis, right hand: Secondary | ICD-10-CM | POA: Diagnosis not present

## 2022-10-05 DIAGNOSIS — M79642 Pain in left hand: Secondary | ICD-10-CM | POA: Diagnosis not present

## 2022-10-05 DIAGNOSIS — M1811 Unilateral primary osteoarthritis of first carpometacarpal joint, right hand: Secondary | ICD-10-CM | POA: Diagnosis not present

## 2022-10-24 DIAGNOSIS — M7061 Trochanteric bursitis, right hip: Secondary | ICD-10-CM | POA: Diagnosis not present

## 2023-01-08 NOTE — Progress Notes (Signed)
Patient Care Team: Noberto Retort, MD as PCP - General (Family Medicine) Jake Bathe, MD as PCP - Cardiology (Cardiology) Ria Comment, FNP as Nurse Practitioner (Family Medicine) Salomon Fick, NP as Nurse Practitioner (Hematology and Oncology)  DIAGNOSIS:  Encounter Diagnosis  Name Primary?   Malignant neoplasm of upper-outer quadrant of right breast in female, estrogen receptor positive (HCC) Yes    SUMMARY OF ONCOLOGIC HISTORY: Oncology History  Breast cancer of upper-outer quadrant of right female breast (HCC)  09/04/2015 Initial Diagnosis   Right breast biopsy invasive ductal carcinoma with papillary features, grade 1, ER/PR positive HER-2 negative Ki-67 3%; 6 month follow-up mamm: stable left breast calcifications but new right breast calcifications 2 sets 2 mm size (only one set biopsied)   10/27/2015 Surgery   Bilateral mastectomies Chelsea Patterson), right mastectomy: IDC grade 1, 2 foci, largest measures 1.8 cm, 2/6 lymph nodes positive T1cN1 stage II a, ER/PR positive HER-2 negative Ki-67 3%; left mastectomy: ALH.   10/27/2015 Oncotype testing   Mammaprint: Low risk, Luminal-type    12/09/2015 - 01/27/2016 Radiation Therapy   Adjuvant radiation therapy ([name]). Right chest wall, 50.4 Gy at 28 fractions.  Right chest/mastectomy scar boost, 10 Gy at 5 fractions    02/2016 - 01/12/2023 Anti-estrogen oral therapy   Anastrozole 1 mg daily; switched to letrozole 2.5 mg daily 05/04/2016 due to myalgias and arthralgias; switched to exemestane 08/10/18 due to severe hot flashes switched back to anastrozole due to insurance   08/12/2016 Miscellaneous   ABC clinical trial (aspirin versus placebo)    Genetic Testing   [facility] CancerNext-Expanded Panel was Negative. Report date is 01/31/2022.  The CancerNext-Expanded gene panel offered by [facility] and includes sequencing, rearrangement, and RNA analysis for the following 77 genes: AIP, ALK, APC, ATM, AXIN2, BAP1, BARD1,  BLM, BMPR1A, BRCA1, BRCA2, BRIP1, CDC73, CDH1, CDK4, CDKN1B, CDKN2A, CHEK2, CTNNA1, DICER1, FANCC, FH, FLCN, GALNT12, KIF1B, LZTR1, MAX, MEN1, MET, MLH1, MSH2, MSH3, MSH6, MUTYH, NBN, NF1, NF2, NTHL1, PALB2, PHOX2B, PMS2, POT1, PRKAR1A, PTCH1, PTEN, RAD51C, RAD51D, RB1, RECQL, RET, SDHA, SDHAF2, SDHB, SDHC, SDHD, SMAD4, SMARCA4, SMARCB1, SMARCE1, STK11, SUFU, TMEM127, TP53, TSC1, TSC2, VHL and XRCC2 (sequencing and deletion/duplication); EGFR, EGLN1, HOXB13, KIT, MITF, PDGFRA, POLD1, and POLE (sequencing only); EPCAM and GREM1 (deletion/duplication only).      CHIEF COMPLIANT: Follow-up of right breast cancer on anastrozole   INTERVAL HISTORY: Chelsea Patterson is a 67 y.o. with above-mentioned history of right breast cancer currently on anastrozole. She presents to the clinic today for follow-up. She has no new issues to report to the clinic today. She tolerated the anastrozole extremely well with severe hot flashes and no other complaints.   MEDICATIONS:  Current Outpatient Medications  Medication Sig Dispense Refill   acetaminophen (TYLENOL) 650 MG CR tablet Take 2,600 mg by mouth every 8 (eight) hours as needed for pain. Reported on 12/22/2015     acyclovir (ZOVIRAX) 400 MG tablet as needed.     anastrozole (ARIMIDEX) 1 MG tablet TAKE 1 TABLET BY MOUTH DAILY 90 tablet 2   AREXVY 120 MCG/0.5ML injection 0.5 mLs.     b complex vitamins tablet Take 1 tablet by mouth daily.     cholecalciferol (VITAMIN D) 1000 UNITS tablet Take by mouth daily. Takes between 2-4     clobetasol cream (TEMOVATE) 0.05 % as needed.     Collagen-Vitamin C (COLLAGEN PLUS VITAMIN C PO) Take 1 tablet by mouth daily. Reported on 12/22/2015     diphenhydramine-acetaminophen (TYLENOL  PM) 25-500 MG TABS tablet Take 1 tablet by mouth at bedtime.      escitalopram (LEXAPRO) 20 MG tablet Take 20 mg by mouth daily.      ezetimibe (ZETIA) 10 MG tablet Take 1 tablet (10 mg total) by mouth daily. 90 tablet 3   fish oil-omega-3 fatty  acids 1000 MG capsule Take 2 g by mouth daily.     hydroquinone 4 % cream Apply topically as needed.     Magnesium Citrate 200 MG TABS Magnesium     Multiple Vitamin (MULTIVITAMIN) tablet Take 1 tablet by mouth daily.     Polyethyl Glycol-Propyl Glycol (SYSTANE OP) Place 1-2 drops into both eyes as needed (for dry eyes).     rosuvastatin (CRESTOR) 20 MG tablet Take 1 tablet (20 mg total) by mouth daily. 90 tablet 3   SPIRIVA RESPIMAT 1.25 MCG/ACT AERS SMARTSIG:2 Puff(s) Via Inhaler Daily     traMADol (ULTRAM) 50 MG tablet Take 1 tablet (50 mg total) by mouth every 12 (twelve) hours as needed. 30 tablet    XOPENEX HFA 45 MCG/ACT inhaler Inhale 2 puffs into the lungs every 4 (four) hours as needed for wheezing or shortness of breath.      No current facility-administered medications for this visit.    PHYSICAL EXAMINATION: ECOG PERFORMANCE STATUS: 1 - Symptomatic but completely ambulatory  Vitals:   01/12/23 1018  BP: 117/80  Pulse: 92  Resp: 18  Temp: (!) 97.5 F (36.4 C)  SpO2: 97%   Filed Weights   01/12/23 1018  Weight: 130 lb 14.4 oz (59.4 kg)      LABORATORY DATA:  I have reviewed the data as listed    Latest Ref Rng & Units 11/17/2021   10:02 AM 08/09/2021   10:25 AM 10/27/2015    4:26 PM  CMP  Creatinine 0.44 - 1.00 mg/dL   1.61   Total Protein 6.0 - 8.5 g/dL  6.6    Total Bilirubin 0.0 - 1.2 mg/dL  0.3    Alkaline Phos 44 - 121 IU/L  82    AST 0 - 40 IU/L  18    ALT 0 - 32 IU/L 13  12      Lab Results  Component Value Date   WBC 14.2 (H) 10/27/2015   HGB 11.2 (L) 10/27/2015   HCT 33.7 (L) 10/27/2015   MCV 96.3 10/27/2015   PLT 249 10/27/2015   NEUTROABS 4.2 10/27/2015    ASSESSMENT & PLAN:  Breast cancer of upper-outer quadrant of right female breast (HCC) Bilateral mastectomies 10/27/2015: Right mastectomy: IDC grade 1, 2 foci, largest measures 1.8 cm, 2/6 lymph nodes positive T1cN1 stage II a, ER/PR positive HER-2 negative Ki-67 3%; left mastectomy:  ALH; Mammaprint low risk, luminal type A (did not need chemotherapy) Adjuvant radiation therapy from 12/09/2015 to 01/27/2016    Current treatment: Adjuvant antiestrogen therapy with anastrozole 1 mg by mouth daily switched to Letrozole 05/04/16 due to musculoskeletal Sx, switched to exemestane January 2020.  But her insurance is not covering it so we switched her back to anastrozole 1 mg daily 01/24/2019.  Patient completed 7 years of therapy completed 01/12/2023.   Anastrozole toxicities: 1.  Hot flashes: Tolerating it well 2.  Joint aches and pains: Attributes to arthritis     ABC clinical trial: Unblinded and patient was found to be on aspirin.  Stopped study drug on 06/19/2020 Her father passed away from metastatic melanoma.   Breast cancer surveillance: 1.  Breast exam  01/12/2023: Benign 2.  Mammogram: Not indicated since she had bilateral mastectomies. 3.  Bone density 01/25/2022: T-score -1.9: Osteopenia: Calcium and vitamin D    Return to clinic on an as-needed basis    No orders of the defined types were placed in this encounter.  The patient has a good understanding of the overall plan. she agrees with it. she will call with any problems that may develop before the next visit here. Total time spent: 30 mins including face to face time and time spent for planning, charting and co-ordination of care   Tamsen Meek, MD 01/12/23    I Janan Ridge am acting as a Neurosurgeon for [employer]  I have reviewed the above documentation for accuracy and completeness, and I agree with the above.

## 2023-01-12 ENCOUNTER — Inpatient Hospital Stay: Payer: Medicare Other | Attending: Hematology and Oncology | Admitting: Hematology and Oncology

## 2023-01-12 ENCOUNTER — Other Ambulatory Visit: Payer: Self-pay

## 2023-01-12 VITALS — BP 117/80 | HR 92 | Temp 97.5°F | Resp 18 | Ht 67.0 in | Wt 130.9 lb

## 2023-01-12 DIAGNOSIS — C50411 Malignant neoplasm of upper-outer quadrant of right female breast: Secondary | ICD-10-CM | POA: Insufficient documentation

## 2023-01-12 DIAGNOSIS — Z170 Estrogen receptor positive status [ER+]: Secondary | ICD-10-CM | POA: Insufficient documentation

## 2023-01-12 DIAGNOSIS — Z808 Family history of malignant neoplasm of other organs or systems: Secondary | ICD-10-CM | POA: Insufficient documentation

## 2023-01-12 DIAGNOSIS — Z79811 Long term (current) use of aromatase inhibitors: Secondary | ICD-10-CM | POA: Diagnosis not present

## 2023-01-12 DIAGNOSIS — M8580 Other specified disorders of bone density and structure, unspecified site: Secondary | ICD-10-CM | POA: Insufficient documentation

## 2023-01-12 NOTE — Assessment & Plan Note (Addendum)
Bilateral mastectomies 10/27/2015: Right mastectomy: IDC grade 1, 2 foci, largest measures 1.8 cm, 2/6 lymph nodes positive T1cN1 stage II a, ER/PR positive HER-2 negative Ki-67 3%; left mastectomy: ALH; Mammaprint low risk, luminal type A (did not need chemotherapy) Adjuvant radiation therapy from 12/09/2015 to 01/27/2016    Current treatment: Adjuvant antiestrogen therapy with anastrozole 1 mg by mouth daily switched to Letrozole 05/04/16 due to musculoskeletal Sx, switched to exemestane January 2020.  But her insurance is not covering it so we switched her back to anastrozole 1 mg daily 01/24/2019.  Patient completed 7 years of therapy completed 01/12/2023.   Anastrozole toxicities: 1.  Hot flashes: Tolerating it well 2.  Joint aches and pains: Attributes to arthritis     ABC clinical trial: Unblinded and patient was found to be on aspirin.  Stopped study drug on Jun 27, 2020 Her father passed away from metastatic melanoma.   Breast cancer surveillance: 1.  Breast exam 01/12/2023: Benign 2.  Mammogram: Not indicated since she had bilateral mastectomies. 3.  Bone density 01/25/2022: T-score -1.9: Osteopenia: Calcium and vitamin D    Return to clinic on an as-needed basis

## 2023-03-16 DIAGNOSIS — M79641 Pain in right hand: Secondary | ICD-10-CM | POA: Diagnosis not present

## 2023-03-16 DIAGNOSIS — M1811 Unilateral primary osteoarthritis of first carpometacarpal joint, right hand: Secondary | ICD-10-CM | POA: Diagnosis not present

## 2023-03-16 DIAGNOSIS — M79642 Pain in left hand: Secondary | ICD-10-CM | POA: Diagnosis not present

## 2023-04-04 DIAGNOSIS — Z23 Encounter for immunization: Secondary | ICD-10-CM | POA: Diagnosis not present

## 2023-04-04 DIAGNOSIS — I700 Atherosclerosis of aorta: Secondary | ICD-10-CM | POA: Diagnosis not present

## 2023-04-04 DIAGNOSIS — E7800 Pure hypercholesterolemia, unspecified: Secondary | ICD-10-CM | POA: Diagnosis not present

## 2023-04-04 DIAGNOSIS — M85851 Other specified disorders of bone density and structure, right thigh: Secondary | ICD-10-CM | POA: Diagnosis not present

## 2023-04-04 DIAGNOSIS — Z0000 Encounter for general adult medical examination without abnormal findings: Secondary | ICD-10-CM | POA: Diagnosis not present

## 2023-04-04 DIAGNOSIS — Z853 Personal history of malignant neoplasm of breast: Secondary | ICD-10-CM | POA: Diagnosis not present

## 2023-04-04 DIAGNOSIS — J449 Chronic obstructive pulmonary disease, unspecified: Secondary | ICD-10-CM | POA: Diagnosis not present

## 2023-04-04 LAB — LAB REPORT - SCANNED: EGFR: 74

## 2023-04-05 ENCOUNTER — Encounter: Payer: Self-pay | Admitting: Hematology and Oncology

## 2023-04-06 ENCOUNTER — Ambulatory Visit (HOSPITAL_COMMUNITY)
Admission: RE | Admit: 2023-04-06 | Discharge: 2023-04-06 | Disposition: A | Discharge status: Home / Self Care | Payer: Medicare Other | Source: Ambulatory Visit | Attending: Family Medicine | Admitting: Family Medicine

## 2023-04-06 DIAGNOSIS — Z87891 Personal history of nicotine dependence: Secondary | ICD-10-CM | POA: Diagnosis not present

## 2023-04-06 DIAGNOSIS — Z122 Encounter for screening for malignant neoplasm of respiratory organs: Secondary | ICD-10-CM | POA: Insufficient documentation

## 2023-04-06 DIAGNOSIS — F17210 Nicotine dependence, cigarettes, uncomplicated: Secondary | ICD-10-CM | POA: Insufficient documentation

## 2023-04-21 ENCOUNTER — Other Ambulatory Visit: Payer: Self-pay

## 2023-04-21 DIAGNOSIS — Z87891 Personal history of nicotine dependence: Secondary | ICD-10-CM

## 2023-04-21 DIAGNOSIS — Z122 Encounter for screening for malignant neoplasm of respiratory organs: Secondary | ICD-10-CM

## 2023-04-21 DIAGNOSIS — F17210 Nicotine dependence, cigarettes, uncomplicated: Secondary | ICD-10-CM

## 2023-06-06 DIAGNOSIS — X32XXXA Exposure to sunlight, initial encounter: Secondary | ICD-10-CM | POA: Diagnosis not present

## 2023-06-06 DIAGNOSIS — Z853 Personal history of malignant neoplasm of breast: Secondary | ICD-10-CM | POA: Diagnosis not present

## 2023-06-06 DIAGNOSIS — L821 Other seborrheic keratosis: Secondary | ICD-10-CM | POA: Diagnosis not present

## 2023-06-06 DIAGNOSIS — D485 Neoplasm of uncertain behavior of skin: Secondary | ICD-10-CM | POA: Diagnosis not present

## 2023-06-06 DIAGNOSIS — D2372 Other benign neoplasm of skin of left lower limb, including hip: Secondary | ICD-10-CM | POA: Diagnosis not present

## 2023-06-06 DIAGNOSIS — L570 Actinic keratosis: Secondary | ICD-10-CM | POA: Diagnosis not present

## 2023-06-06 DIAGNOSIS — Z808 Family history of malignant neoplasm of other organs or systems: Secondary | ICD-10-CM | POA: Diagnosis not present

## 2023-06-06 DIAGNOSIS — L578 Other skin changes due to chronic exposure to nonionizing radiation: Secondary | ICD-10-CM | POA: Diagnosis not present

## 2023-06-06 DIAGNOSIS — Z7189 Other specified counseling: Secondary | ICD-10-CM | POA: Diagnosis not present

## 2023-06-06 DIAGNOSIS — Z85828 Personal history of other malignant neoplasm of skin: Secondary | ICD-10-CM | POA: Diagnosis not present

## 2023-06-06 DIAGNOSIS — C44712 Basal cell carcinoma of skin of right lower limb, including hip: Secondary | ICD-10-CM | POA: Diagnosis not present

## 2023-06-06 DIAGNOSIS — D0461 Carcinoma in situ of skin of right upper limb, including shoulder: Secondary | ICD-10-CM | POA: Diagnosis not present

## 2023-06-06 DIAGNOSIS — L814 Other melanin hyperpigmentation: Secondary | ICD-10-CM | POA: Diagnosis not present

## 2023-06-06 DIAGNOSIS — Z08 Encounter for follow-up examination after completed treatment for malignant neoplasm: Secondary | ICD-10-CM | POA: Diagnosis not present

## 2023-06-12 DIAGNOSIS — G5603 Carpal tunnel syndrome, bilateral upper limbs: Secondary | ICD-10-CM | POA: Diagnosis not present

## 2023-06-12 DIAGNOSIS — M1811 Unilateral primary osteoarthritis of first carpometacarpal joint, right hand: Secondary | ICD-10-CM | POA: Diagnosis not present

## 2023-06-12 DIAGNOSIS — D0461 Carcinoma in situ of skin of right upper limb, including shoulder: Secondary | ICD-10-CM | POA: Diagnosis not present

## 2023-06-12 DIAGNOSIS — M79644 Pain in right finger(s): Secondary | ICD-10-CM | POA: Diagnosis not present

## 2023-06-15 ENCOUNTER — Other Ambulatory Visit: Payer: Self-pay | Admitting: Cardiology

## 2023-06-29 DIAGNOSIS — M8588 Other specified disorders of bone density and structure, other site: Secondary | ICD-10-CM | POA: Diagnosis not present

## 2023-06-29 DIAGNOSIS — Z853 Personal history of malignant neoplasm of breast: Secondary | ICD-10-CM | POA: Diagnosis not present

## 2023-06-30 DIAGNOSIS — C44712 Basal cell carcinoma of skin of right lower limb, including hip: Secondary | ICD-10-CM | POA: Diagnosis not present

## 2023-07-27 DIAGNOSIS — L570 Actinic keratosis: Secondary | ICD-10-CM | POA: Diagnosis not present

## 2023-07-27 DIAGNOSIS — X32XXXA Exposure to sunlight, initial encounter: Secondary | ICD-10-CM | POA: Diagnosis not present

## 2023-08-24 ENCOUNTER — Other Ambulatory Visit: Payer: Self-pay | Admitting: Cardiology

## 2023-08-28 ENCOUNTER — Encounter: Payer: Self-pay | Admitting: Physician Assistant

## 2023-08-28 ENCOUNTER — Ambulatory Visit: Payer: Medicare Other | Admitting: Cardiology

## 2023-08-28 ENCOUNTER — Ambulatory Visit: Payer: Medicare Other | Attending: Physician Assistant | Admitting: Physician Assistant

## 2023-08-28 VITALS — BP 108/70 | Ht 67.0 in | Wt 131.8 lb

## 2023-08-28 DIAGNOSIS — Z720 Tobacco use: Secondary | ICD-10-CM | POA: Diagnosis not present

## 2023-08-28 DIAGNOSIS — I2510 Atherosclerotic heart disease of native coronary artery without angina pectoris: Secondary | ICD-10-CM

## 2023-08-28 DIAGNOSIS — E785 Hyperlipidemia, unspecified: Secondary | ICD-10-CM

## 2023-08-28 DIAGNOSIS — I700 Atherosclerosis of aorta: Secondary | ICD-10-CM | POA: Diagnosis not present

## 2023-08-28 DIAGNOSIS — Z8249 Family history of ischemic heart disease and other diseases of the circulatory system: Secondary | ICD-10-CM | POA: Diagnosis not present

## 2023-08-28 LAB — CBC

## 2023-08-28 NOTE — Patient Instructions (Signed)
 Medication Instructions:  Your physician recommends that you continue on your current medications as directed. Please refer to the Current Medication list given to you today.  *If you need a refill on your cardiac medications before your next appointment, please call your pharmacy*   Lab Work: TODAY:  CMET, CBC, TSH, & LIPID  If you have labs (blood work) drawn today and your tests are completely normal, you will receive your results only by: MyChart Message (if you have MyChart) OR A paper copy in the mail If you have any lab test that is abnormal or we need to change your treatment, we will call you to review the results.   Testing/Procedures: None ordered   Follow-Up: At [facility], you and your health needs are our priority.  As part of our continuing mission to provide you with exceptional heart care, we have created designated Provider Care Teams.  These Care Teams include your primary Cardiologist (physician) and Advanced Practice Providers (APPs -  Physician Assistants and Nurse Practitioners) who all work together to provide you with the care you need, when you need it.  We recommend signing up for the patient portal called "MyChart".  Sign up information is provided on this After Visit Summary.  MyChart is used to connect with patients for Virtual Visits (Telemedicine).  Patients are able to view lab/test results, encounter notes, upcoming appointments, etc.  Non-urgent messages can be sent to your provider as well.   To learn more about what you can do with MyChart, go to [email or web address].    Your next appointment:   12 month(s)  Provider:   Dorothye Gathers, MD     Other Instructions If you wish to quit smoking, help is available.  For information on [facility], plus links to online education, apps and resources, visit [email or web address].       1st Floor: - Lobby - Registration  - Pharmacy  - Lab - Cafe  2nd Floor: - PV Lab -  Diagnostic Testing (echo, CT, nuclear med)  3rd Floor: - Vacant  4th Floor: - TCTS (cardiothoracic surgery) - AFib Clinic - Structural Heart Clinic - Vascular Surgery  - Vascular Ultrasound  5th Floor: - [facility] (general and EP) - Clinical Pharmacy for coumadin, hypertension, lipid, weight-loss medications, and med management appointments    Valet parking services will be available as well.

## 2023-08-28 NOTE — Progress Notes (Signed)
  Cardiology Office Note:  .   Date:  08/28/2023  ID:  Chelsea Patterson, DOB [birth year], MRN [id number] PCP: Chelsea Congo, MD  [facility] Providers Cardiologist:  Chelsea Gathers, MD    History of Present Illness: .   Chelsea Patterson is a 68 y.o. female with history of HLD, coronary artery calcification seen on lung cancer screening CT 2022, NST low risk 2022, 37 pack year smoker, family history CAD.  Chronic DOE due to COPD, still smokes 3-4 cigarettes daily. Occasional tingling down her left side, dizzy if she sits too long and gets up quickly-only on occasion. She goes to the gym and does elliptical 20-30 min, rowing, some machines for strength-2-3 times a week. Push mows her yard in the summer, walks her dog. Drinks coffee and tea all day long with water in between.  ROS:    Studies Reviewed: Chelsea Patterson        Prior CV Studies:    CT Chest 04/01/21 1. Lung-RADS 2, benign appearance or behavior. Continue annual screening with low-dose chest CT without contrast in 12 months. 2. Emphysema and aortic atherosclerosis. 3. Coronary artery calcifications. Aortic Atherosclerosis (ICD10-I70.0) and Emphysema (ICD10-J43.9).   Nuclear stress test 06/16/2021:   The study is low risk.   No ST deviation was noted.   LV perfusion is abnormal.   Defect 1: There is a small defect with mild reduction in uptake present in the apical inferior and apex location(s) that is reversible. There is normal wall motion in the defect area. The defect most likely represents artifact due to diaphragmatic attenutation and tracer uptake in the gut as wall motion appears normal, however, mild ischemia cannot be ruled out.   Left ventricular function is normal. Nuclear stress EF: 60 %. The left ventricular ejection fraction is normal (55-65%). End diastolic cavity size is normal.   Prior study not available for comparison.    Risk Assessment/Calculations:             Physical Exam:   VS:  BP 108/70   Ht 5'  7" (1.702 m)   Wt 131 lb 12.8 oz (59.8 kg)   LMP 09/15/2009 (Approximate)   SpO2 96%   BMI 20.64 kg/m    Wt Readings from Last 3 Encounters:  08/28/23 131 lb 12.8 oz (59.8 kg)  01/12/23 130 lb 14.4 oz (59.4 kg)  08/23/22 131 lb (59.4 kg)    GEN: Thin, in no acute distress NECK: No JVD; No carotid bruits CARDIAC:  RRR, no murmurs, rubs, gallops RESPIRATORY:  Clear to auscultation without rales, wheezing or rhonchi  ABDOMEN: Soft, non-tender, non-distended EXTREMITIES:  No edema; No deformity   ASSESSMENT AND PLAN: .    Coronary art calcification on lung CT 2022 on crestor , NST low risk 2022-no symptoms, gets 150 min exercise weekly. Due for labs. Decrease caffeine and smoking cessation  HLD on crestor  and Zetia -check FLP today  Strong family history of CAD  Tobacco abuse-has tried nicotine patches, gum, chantix, hypnosis without success. Will refer to [facility] smoking cessation.          Dispo: f/u in 1 yr.  Signed, Chelsea Flake, PA-C

## 2023-08-29 ENCOUNTER — Encounter: Payer: Self-pay | Admitting: Hematology and Oncology

## 2023-08-29 ENCOUNTER — Telehealth: Payer: Self-pay

## 2023-08-29 DIAGNOSIS — E785 Hyperlipidemia, unspecified: Secondary | ICD-10-CM

## 2023-08-29 LAB — COMPREHENSIVE METABOLIC PANEL
ALT: 9 [IU]/L (ref 0–32)
AST: 21 [IU]/L (ref 0–40)
Albumin: 4.5 g/dL (ref 3.9–4.9)
Alkaline Phosphatase: 83 [IU]/L (ref 44–121)
BUN/Creatinine Ratio: 12 (ref 12–28)
BUN: 10 mg/dL (ref 8–27)
Bilirubin Total: 0.4 mg/dL (ref 0.0–1.2)
CO2: 24 mmol/L (ref 20–29)
Calcium: 9.5 mg/dL (ref 8.7–10.3)
Chloride: 103 mmol/L (ref 96–106)
Creatinine, Ser: 0.86 mg/dL (ref 0.57–1.00)
Globulin, Total: 2.3 g/dL (ref 1.5–4.5)
Glucose: 85 mg/dL (ref 70–99)
Potassium: 5.3 mmol/L — ABNORMAL HIGH (ref 3.5–5.2)
Sodium: 139 mmol/L (ref 134–144)
Total Protein: 6.8 g/dL (ref 6.0–8.5)
eGFR: 73 mL/min/{1.73_m2} (ref >59)

## 2023-08-29 LAB — CBC
Hematocrit: 43.7 % (ref 34.0–46.6)
Hemoglobin: 14 g/dL (ref 11.1–15.9)
MCH: 31.3 pg (ref 26.6–33.0)
MCHC: 32 g/dL (ref 31.5–35.7)
MCV: 98 fL — ABNORMAL HIGH (ref 79–97)
Platelets: 354 10*3/uL (ref 150–450)
RBC: 4.48 x10E6/uL (ref 3.77–5.28)
RDW: 11.8 % (ref 11.7–15.4)
WBC: 9.4 10*3/uL (ref 3.4–10.8)

## 2023-08-29 LAB — TSH: TSH: 1.48 u[IU]/mL (ref 0.450–4.500)

## 2023-08-29 LAB — LIPID PANEL
Chol/HDL Ratio: 3.6 {ratio} (ref 0.0–4.4)
Cholesterol, Total: 125 mg/dL (ref 100–199)
HDL: 35 mg/dL — ABNORMAL LOW (ref >39)
LDL Chol Calc (NIH): 60 mg/dL (ref 0–99)
Triglycerides: 180 mg/dL — ABNORMAL HIGH (ref 0–149)
VLDL Cholesterol Cal: 30 mg/dL (ref 5–40)

## 2023-08-29 NOTE — Telephone Encounter (Signed)
-----   Message from Jacolyn Reedy sent at 08/29/2023  7:37 AM EST ----- Potassium up slightly. Ask her to stop her multivitamin and recheck bmet in 2 weeks.lipids good but triglycerides high. Please reduce simple sugars and sweets in diet

## 2023-08-29 NOTE — Telephone Encounter (Signed)
Spoke with patient about recent lab work and [employer] recommendations. Patient will hold multivitamin and repeat BMET on 09/11/23. No questions at this time

## 2023-09-01 DIAGNOSIS — L570 Actinic keratosis: Secondary | ICD-10-CM | POA: Diagnosis not present

## 2023-09-01 DIAGNOSIS — X32XXXA Exposure to sunlight, initial encounter: Secondary | ICD-10-CM | POA: Diagnosis not present

## 2023-09-12 DIAGNOSIS — L570 Actinic keratosis: Secondary | ICD-10-CM | POA: Diagnosis not present

## 2023-09-12 DIAGNOSIS — X32XXXA Exposure to sunlight, initial encounter: Secondary | ICD-10-CM | POA: Diagnosis not present

## 2023-09-26 DIAGNOSIS — L249 Irritant contact dermatitis, unspecified cause: Secondary | ICD-10-CM | POA: Diagnosis not present

## 2023-10-02 DIAGNOSIS — M13841 Other specified arthritis, right hand: Secondary | ICD-10-CM | POA: Diagnosis not present

## 2023-10-04 ENCOUNTER — Other Ambulatory Visit: Payer: Self-pay | Admitting: Cardiology

## 2023-11-14 DIAGNOSIS — H2513 Age-related nuclear cataract, bilateral: Secondary | ICD-10-CM | POA: Diagnosis not present

## 2023-11-27 DIAGNOSIS — L578 Other skin changes due to chronic exposure to nonionizing radiation: Secondary | ICD-10-CM | POA: Diagnosis not present

## 2023-11-27 DIAGNOSIS — D485 Neoplasm of uncertain behavior of skin: Secondary | ICD-10-CM | POA: Diagnosis not present

## 2023-11-27 DIAGNOSIS — L814 Other melanin hyperpigmentation: Secondary | ICD-10-CM | POA: Diagnosis not present

## 2023-11-27 DIAGNOSIS — X32XXXA Exposure to sunlight, initial encounter: Secondary | ICD-10-CM | POA: Diagnosis not present

## 2023-12-13 DIAGNOSIS — D485 Neoplasm of uncertain behavior of skin: Secondary | ICD-10-CM | POA: Diagnosis not present

## 2023-12-13 DIAGNOSIS — L570 Actinic keratosis: Secondary | ICD-10-CM | POA: Diagnosis not present

## 2023-12-27 DIAGNOSIS — M1811 Unilateral primary osteoarthritis of first carpometacarpal joint, right hand: Secondary | ICD-10-CM | POA: Diagnosis not present

## 2023-12-27 DIAGNOSIS — G5602 Carpal tunnel syndrome, left upper limb: Secondary | ICD-10-CM | POA: Diagnosis not present

## 2023-12-27 DIAGNOSIS — G5603 Carpal tunnel syndrome, bilateral upper limbs: Secondary | ICD-10-CM | POA: Diagnosis not present

## 2023-12-27 DIAGNOSIS — G5601 Carpal tunnel syndrome, right upper limb: Secondary | ICD-10-CM | POA: Diagnosis not present

## 2023-12-28 DIAGNOSIS — R202 Paresthesia of skin: Secondary | ICD-10-CM | POA: Diagnosis not present

## 2024-01-09 DIAGNOSIS — G5622 Lesion of ulnar nerve, left upper limb: Secondary | ICD-10-CM | POA: Diagnosis not present

## 2024-01-15 DIAGNOSIS — G5603 Carpal tunnel syndrome, bilateral upper limbs: Secondary | ICD-10-CM | POA: Diagnosis not present

## 2024-01-15 DIAGNOSIS — K219 Gastro-esophageal reflux disease without esophagitis: Secondary | ICD-10-CM | POA: Diagnosis not present

## 2024-01-23 DIAGNOSIS — K2100 Gastro-esophageal reflux disease with esophagitis, without bleeding: Secondary | ICD-10-CM | POA: Diagnosis not present

## 2024-01-23 DIAGNOSIS — R1013 Epigastric pain: Secondary | ICD-10-CM | POA: Diagnosis not present

## 2024-01-23 DIAGNOSIS — B370 Candidal stomatitis: Secondary | ICD-10-CM | POA: Diagnosis not present

## 2024-02-08 DIAGNOSIS — K219 Gastro-esophageal reflux disease without esophagitis: Secondary | ICD-10-CM | POA: Diagnosis not present

## 2024-02-08 DIAGNOSIS — R1013 Epigastric pain: Secondary | ICD-10-CM | POA: Diagnosis not present

## 2024-02-08 DIAGNOSIS — Z1211 Encounter for screening for malignant neoplasm of colon: Secondary | ICD-10-CM | POA: Diagnosis not present

## 2024-03-11 DIAGNOSIS — Z1211 Encounter for screening for malignant neoplasm of colon: Secondary | ICD-10-CM | POA: Diagnosis not present

## 2024-03-11 DIAGNOSIS — R1013 Epigastric pain: Secondary | ICD-10-CM | POA: Diagnosis not present

## 2024-03-11 DIAGNOSIS — Z860100 Personal history of colon polyps, unspecified: Secondary | ICD-10-CM | POA: Diagnosis not present

## 2024-03-11 DIAGNOSIS — K219 Gastro-esophageal reflux disease without esophagitis: Secondary | ICD-10-CM | POA: Diagnosis not present

## 2024-03-11 DIAGNOSIS — D123 Benign neoplasm of transverse colon: Secondary | ICD-10-CM | POA: Diagnosis not present

## 2024-03-11 DIAGNOSIS — D125 Benign neoplasm of sigmoid colon: Secondary | ICD-10-CM | POA: Diagnosis not present

## 2024-03-11 DIAGNOSIS — R1310 Dysphagia, unspecified: Secondary | ICD-10-CM | POA: Diagnosis not present

## 2024-03-11 DIAGNOSIS — D120 Benign neoplasm of cecum: Secondary | ICD-10-CM | POA: Diagnosis not present

## 2024-03-11 DIAGNOSIS — K635 Polyp of colon: Secondary | ICD-10-CM | POA: Diagnosis not present

## 2024-03-15 ENCOUNTER — Encounter: Payer: Self-pay | Admitting: Gastroenterology

## 2024-03-19 ENCOUNTER — Encounter: Payer: Self-pay | Admitting: Gastroenterology

## 2024-04-08 ENCOUNTER — Ambulatory Visit (HOSPITAL_COMMUNITY)
Admission: RE | Admit: 2024-04-08 | Discharge: 2024-04-08 | Disposition: A | Discharge status: Home / Self Care | Source: Ambulatory Visit | Attending: Acute Care | Admitting: Acute Care

## 2024-04-08 DIAGNOSIS — Z122 Encounter for screening for malignant neoplasm of respiratory organs: Secondary | ICD-10-CM | POA: Insufficient documentation

## 2024-04-08 DIAGNOSIS — Z87891 Personal history of nicotine dependence: Secondary | ICD-10-CM | POA: Diagnosis not present

## 2024-04-08 DIAGNOSIS — F17210 Nicotine dependence, cigarettes, uncomplicated: Secondary | ICD-10-CM | POA: Diagnosis not present

## 2024-04-12 DIAGNOSIS — K219 Gastro-esophageal reflux disease without esophagitis: Secondary | ICD-10-CM | POA: Diagnosis not present

## 2024-04-12 DIAGNOSIS — E7800 Pure hypercholesterolemia, unspecified: Secondary | ICD-10-CM | POA: Diagnosis not present

## 2024-04-12 DIAGNOSIS — K5909 Other constipation: Secondary | ICD-10-CM | POA: Diagnosis not present

## 2024-04-12 DIAGNOSIS — J449 Chronic obstructive pulmonary disease, unspecified: Secondary | ICD-10-CM | POA: Diagnosis not present

## 2024-04-12 DIAGNOSIS — Z0000 Encounter for general adult medical examination without abnormal findings: Secondary | ICD-10-CM | POA: Diagnosis not present

## 2024-04-12 DIAGNOSIS — Z23 Encounter for immunization: Secondary | ICD-10-CM | POA: Diagnosis not present

## 2024-04-12 DIAGNOSIS — Z853 Personal history of malignant neoplasm of breast: Secondary | ICD-10-CM | POA: Diagnosis not present

## 2024-04-15 DIAGNOSIS — M13841 Other specified arthritis, right hand: Secondary | ICD-10-CM | POA: Diagnosis not present

## 2024-04-15 DIAGNOSIS — M19041 Primary osteoarthritis, right hand: Secondary | ICD-10-CM | POA: Diagnosis not present

## 2024-04-15 DIAGNOSIS — G5603 Carpal tunnel syndrome, bilateral upper limbs: Secondary | ICD-10-CM | POA: Diagnosis not present

## 2024-04-15 DIAGNOSIS — M1811 Unilateral primary osteoarthritis of first carpometacarpal joint, right hand: Secondary | ICD-10-CM | POA: Diagnosis not present

## 2024-04-15 DIAGNOSIS — M25532 Pain in left wrist: Secondary | ICD-10-CM | POA: Diagnosis not present

## 2024-04-16 ENCOUNTER — Other Ambulatory Visit: Payer: Self-pay | Admitting: Acute Care

## 2024-04-16 DIAGNOSIS — F17210 Nicotine dependence, cigarettes, uncomplicated: Secondary | ICD-10-CM

## 2024-04-16 DIAGNOSIS — Z87891 Personal history of nicotine dependence: Secondary | ICD-10-CM

## 2024-04-16 DIAGNOSIS — Z122 Encounter for screening for malignant neoplasm of respiratory organs: Secondary | ICD-10-CM

## 2024-04-23 DIAGNOSIS — H5711 Ocular pain, right eye: Secondary | ICD-10-CM | POA: Diagnosis not present

## 2024-04-23 DIAGNOSIS — H00022 Hordeolum internum right lower eyelid: Secondary | ICD-10-CM | POA: Diagnosis not present

## 2024-05-01 DIAGNOSIS — L570 Actinic keratosis: Secondary | ICD-10-CM | POA: Diagnosis not present

## 2024-05-01 DIAGNOSIS — X32XXXA Exposure to sunlight, initial encounter: Secondary | ICD-10-CM | POA: Diagnosis not present
